# Patient Record
Sex: Female | Born: 1975 | Race: Black or African American | Hispanic: No | Marital: Married | Smoking: Never smoker
Health system: Southern US, Community
[De-identification: ages and names within clinical notes are randomized; demographics above are authoritative.]

## PROBLEM LIST (undated history)

## (undated) DIAGNOSIS — R51 Headache: Secondary | ICD-10-CM

## (undated) DIAGNOSIS — J45909 Unspecified asthma, uncomplicated: Secondary | ICD-10-CM

## (undated) DIAGNOSIS — L509 Urticaria, unspecified: Secondary | ICD-10-CM

## (undated) HISTORY — PX: DILATION AND CURETTAGE OF UTERUS: SHX78

## (undated) HISTORY — PX: HERNIA REPAIR: SHX51

## (undated) HISTORY — DX: Urticaria, unspecified: L50.9

## (undated) HISTORY — DX: Headache: R51

## (undated) HISTORY — DX: Unspecified asthma, uncomplicated: J45.909

## (undated) HISTORY — PX: INTRAUTERINE DEVICE (IUD) INSERTION: SHX5877

---

## 1997-09-16 ENCOUNTER — Emergency Department (HOSPITAL_COMMUNITY): Admission: EM | Admit: 1997-09-16 | Discharge: 1997-09-16 | Payer: Self-pay | Admitting: Emergency Medicine

## 1997-12-15 ENCOUNTER — Other Ambulatory Visit: Admission: RE | Admit: 1997-12-15 | Discharge: 1997-12-15 | Payer: Self-pay | Admitting: Obstetrics and Gynecology

## 1998-04-21 ENCOUNTER — Ambulatory Visit (HOSPITAL_BASED_OUTPATIENT_CLINIC_OR_DEPARTMENT_OTHER): Admission: RE | Admit: 1998-04-21 | Discharge: 1998-04-21 | Payer: Self-pay | Admitting: Surgery

## 2000-06-16 ENCOUNTER — Other Ambulatory Visit: Admission: RE | Admit: 2000-06-16 | Discharge: 2000-06-16 | Payer: Self-pay | Admitting: Obstetrics

## 2003-02-15 ENCOUNTER — Other Ambulatory Visit: Admission: RE | Admit: 2003-02-15 | Discharge: 2003-02-15 | Payer: Self-pay | Admitting: Obstetrics and Gynecology

## 2003-09-18 ENCOUNTER — Emergency Department (HOSPITAL_COMMUNITY): Admission: EM | Admit: 2003-09-18 | Discharge: 2003-09-19 | Payer: Self-pay | Admitting: Emergency Medicine

## 2006-01-28 ENCOUNTER — Encounter: Admission: RE | Admit: 2006-01-28 | Discharge: 2006-01-28 | Payer: Self-pay | Admitting: Obstetrics and Gynecology

## 2006-02-21 ENCOUNTER — Emergency Department (HOSPITAL_COMMUNITY): Admission: EM | Admit: 2006-02-21 | Discharge: 2006-02-21 | Payer: Self-pay | Admitting: Family Medicine

## 2006-10-09 ENCOUNTER — Encounter (INDEPENDENT_AMBULATORY_CARE_PROVIDER_SITE_OTHER): Payer: Self-pay | Admitting: Obstetrics and Gynecology

## 2006-10-09 ENCOUNTER — Ambulatory Visit (HOSPITAL_COMMUNITY): Admission: RE | Admit: 2006-10-09 | Discharge: 2006-10-09 | Payer: Self-pay | Admitting: Obstetrics and Gynecology

## 2007-11-03 ENCOUNTER — Ambulatory Visit: Admission: RE | Admit: 2007-11-03 | Discharge: 2007-11-03 | Payer: Self-pay | Admitting: Gynecologic Oncology

## 2007-12-15 ENCOUNTER — Ambulatory Visit: Admission: RE | Admit: 2007-12-15 | Discharge: 2007-12-15 | Payer: Self-pay | Admitting: Gynecologic Oncology

## 2007-12-16 ENCOUNTER — Ambulatory Visit (HOSPITAL_COMMUNITY): Admission: RE | Admit: 2007-12-16 | Discharge: 2007-12-16 | Payer: Self-pay | Admitting: Gynecologic Oncology

## 2008-02-07 ENCOUNTER — Emergency Department (HOSPITAL_COMMUNITY): Admission: EM | Admit: 2008-02-07 | Discharge: 2008-02-07 | Payer: Self-pay | Admitting: Emergency Medicine

## 2008-04-17 ENCOUNTER — Emergency Department (HOSPITAL_COMMUNITY): Admission: EM | Admit: 2008-04-17 | Discharge: 2008-04-17 | Payer: Self-pay | Admitting: Emergency Medicine

## 2008-06-15 ENCOUNTER — Inpatient Hospital Stay (HOSPITAL_COMMUNITY): Admission: AD | Admit: 2008-06-15 | Discharge: 2008-06-18 | Payer: Self-pay | Admitting: Obstetrics and Gynecology

## 2008-06-21 ENCOUNTER — Encounter: Admission: RE | Admit: 2008-06-21 | Discharge: 2008-07-21 | Payer: Self-pay | Admitting: Obstetrics and Gynecology

## 2008-07-22 ENCOUNTER — Encounter: Admission: RE | Admit: 2008-07-22 | Discharge: 2008-08-20 | Payer: Self-pay | Admitting: Obstetrics and Gynecology

## 2008-08-21 ENCOUNTER — Encounter: Admission: RE | Admit: 2008-08-21 | Discharge: 2008-09-20 | Payer: Self-pay | Admitting: Obstetrics and Gynecology

## 2008-09-15 ENCOUNTER — Encounter (INDEPENDENT_AMBULATORY_CARE_PROVIDER_SITE_OTHER): Payer: Self-pay | Admitting: Obstetrics and Gynecology

## 2008-09-15 ENCOUNTER — Ambulatory Visit (HOSPITAL_COMMUNITY): Admission: RE | Admit: 2008-09-15 | Discharge: 2008-09-16 | Payer: Self-pay | Admitting: Obstetrics and Gynecology

## 2008-09-21 ENCOUNTER — Encounter: Admission: RE | Admit: 2008-09-21 | Discharge: 2008-10-20 | Payer: Self-pay | Admitting: Obstetrics and Gynecology

## 2008-10-21 ENCOUNTER — Encounter: Admission: RE | Admit: 2008-10-21 | Discharge: 2008-11-20 | Payer: Self-pay | Admitting: Obstetrics and Gynecology

## 2008-11-21 ENCOUNTER — Encounter: Admission: RE | Admit: 2008-11-21 | Discharge: 2008-11-28 | Payer: Self-pay | Admitting: Obstetrics and Gynecology

## 2010-07-09 LAB — CBC
HCT: 34.9 % — ABNORMAL LOW (ref 36.0–46.0)
HCT: 39.8 % (ref 36.0–46.0)
Hemoglobin: 12.2 g/dL (ref 12.0–15.0)
Hemoglobin: 13.9 g/dL (ref 12.0–15.0)
MCHC: 35 g/dL (ref 30.0–36.0)
MCV: 90 fL (ref 78.0–100.0)
RBC: 3.88 MIL/uL (ref 3.87–5.11)
RBC: 4.44 MIL/uL (ref 3.87–5.11)
RDW: 13.2 % (ref 11.5–15.5)
WBC: 11 10*3/uL — ABNORMAL HIGH (ref 4.0–10.5)
WBC: 8.2 10*3/uL (ref 4.0–10.5)

## 2010-07-12 LAB — CBC
HCT: 34.5 % — ABNORMAL LOW (ref 36.0–46.0)
Hemoglobin: 11.6 g/dL — ABNORMAL LOW (ref 12.0–15.0)
MCHC: 33.8 g/dL (ref 30.0–36.0)
MCHC: 34 g/dL (ref 30.0–36.0)
MCV: 91.3 fL (ref 78.0–100.0)
Platelets: 219 10*3/uL (ref 150–400)
Platelets: 228 10*3/uL (ref 150–400)
RDW: 15.3 % (ref 11.5–15.5)
RDW: 16.1 % — ABNORMAL HIGH (ref 11.5–15.5)

## 2010-07-12 LAB — RPR: RPR Ser Ql: NONREACTIVE

## 2010-08-14 NOTE — Op Note (Signed)
NAMESATOYA, FEELEY                ACCOUNT NO.:  [id number]   MEDICAL RECORD NO.:  [id number]          PATIENT TYPE:  AMB   LOCATION:  SDC                           FACILITY:  [facility]   PHYSICIAN:  Maxie Better, M.D.DATE OF BIRTH:  05-10-1975   DATE OF PROCEDURE:  10/09/2006  DATE OF DISCHARGE:                               OPERATIVE REPORT   PREOPERATIVE DIAGNOSIS:  Missed abortion, twin gestation at 9 weeks,  vaginal bleeding.   PROCEDURES:  The ultrasound-guided suction dilation and evacuation.   POSTOPERATIVE DIAGNOSIS:  Missed abortion, twin gestation at 9 weeks,  vaginal bleeding.   ANESTHESIA:  MAC, paracervical block.   SURGEON:  Maxie Better, M.D.   INDICATIONS:  A 35 year old gravida 5, para 2-0-2-2 female who was found  by ultrasound to have fetal demise of twin gestation at 36 weeks and now  presents for surgical management on. The day of her procedure, the  patient started to have passage of fist size clots and bleeding and  wished to proceed with the procedure. Surgical risks had been reviewed  with the patient.  Consent was signed. The patient was transferred to  the operating room.   DESCRIPTION OF PROCEDURE:  Under adequate monitored anesthesia the  patient is placed in the dorsal lithotomy position.  She was sterilely  prepped and draped in usual fashion.  The bladder was catheterized small  amount of urine.  Examination under anesthesia revealed a uterus that  was about 8-week size with of clotted material and tissue at the  cervical os.  Bivalve speculum placed in the vagina and 20 mL of 1%  Nesacaine was injected paracervical at 3 and 9 o'clock.  The anterior  lip has the Nabothian cyst.  The anterior lip was grasped with a single-  tooth tenaculum.  Clotted material was removed from the cervical os.  The cervix was already dilated and easily accepted a #29 Pratt dilator.  A 8-mm curved suction cannula was introduced into the uterine cavity and  with ultrasound guidance the cavity was curetted of tissue had been  remained. Cavity was then curetted also under ultrasound  guidance. When  the endometrial stripe appeared to be normal, the procedure was then  terminated. All the instruments were then removed from the vagina.  Specimen labeled products of conception was sent to pathology.  Estimated blood loss was minimal.  Complication was none.  Maternal  blood type is A+.  The patient tolerated procedure well, was transferred  to recovery in stable condition.      Maxie Better, M.D.  Electronically Signed     [place]/MEDQ  D:  10/09/2006  T:  10/09/2006  Job:  [id number]

## 2010-08-14 NOTE — Consult Note (Signed)
NAMECARALINE, DEUTSCHMAN                ACCOUNT NO.:  [id number]   MEDICAL RECORD NO.:  [id number]          PATIENT TYPE:  OUT   LOCATION:  GYN                          FACILITY:  [facility]   PHYSICIAN:  Paola A. Duard Brady, MD    DATE OF BIRTH:  1975-08-24   DATE OF CONSULTATION:  11/03/2007  DATE OF DISCHARGE:                                 CONSULTATION   REFERRING PHYSICIAN:  Dr. Maxie Better.   Ms. Durwin Nora is a 35 year old gravida 6, para 2, who is currently at 7  weeks' gestation by last menstrual period and early ultrasound.  She had  a new OB ultrasound performed on October 27, 2007.  It revealed a single  heartbeat with a crown-rump length consistent with her LMP of September 12, 2007.  She had a right corpus luteum.  There was a left adnexal mass  with hyperechoic poor sound penetration shadowing measuring 7 x 4.9 x  6.3 cm, could not exclude a dermoid, 2 other small fibroids - 1  measuring 2 cm and 1 measuring 0.9 cm.  She is referred to [place] today for  evaluation and consideration of this adnexal mass.  She states that she  is really doing quite well.  She has fatigue associated with an early  pregnancy.  She states that prior to pregnancy and hindsight she has had  some left-sided discomfort, some bloating.  No nausea, though.   PAST SURGICAL HISTORY:  1. Umbilical hernia repair in 1998 with mesh.  2. Eye surgery.  3. NSVD x2.   MEDICATIONS:  1. Protopic for her face.  2. Prenatal vitamins.   ALLERGIES:  None.   PAST MEDICAL HISTORY:  None.   SOCIAL HISTORY:  She denies use of tobacco.  She drank alcohol socially  prior to pregnancy.  She is married.  She works as a Sports coach for  [employer], site.  Her husband as a Investment banker, operational.   FAMILY HISTORY:  Her mother has diabetes.  Her father had gastric  cancer.  Maternal grandmother with colon cancer.   PHYSICAL EXAMINATION:  Height 5 feet 8, weight 250 pounds, blood  pressure 106/66, pulse 80.  Well-nourished, well-developed female in  no  acute distress.  NECK:  Supple.  There is no lymphadenopathy, no thyromegaly.  LUNGS:  Clear to auscultation bilaterally.  CARDIOVASCULAR:  Regular rate and rhythm.  ABDOMEN:  Shows well-healed infraumbilical incision.  Abdomen is soft,  nontender, nondistended.  There are no palpable masses or  hepatosplenomegaly.  Groins are negative for adenopathy.  EXTREMITIES:  There was no edema.  PELVIC:  External genitalia was within normal limits.  Bimanual  examination:  Cervix is palpably normal, long and closed.  The uterus is  tilted anteverted.  There is a fullness in the posterior cul-de-sac.  She is nontender.   ASSESSMENT:  A 35 year old with a 7-week intrauterine pregnancy by early  ultrasound and LMP who has a complex-appearing 7-cm mass that is felt to  be consistent with a dermoid on early ultrasound.  Had a lengthy  discussion with the patient and her husband regarding management  of  this.  I would recommend a repeat ultrasound at 12-13 weeks.  If the  mass is stable or smaller, one could consider close follow-up during the  pregnancy.  If the mass is larger, may need to consider surgical  evaluation during the pregnancy.  The patient is obviously very tearful  during this part of the discussion.  It has been a year since she had a  miscarriage, and she is very excited about this pregnancy.  We did  discuss that if we needed to proceed with surgery we would like to do it  in the mid second trimester in the 14-16 week time frame as that  minimizes the risk to the fetus and the mother.  Because of her  umbilical hernia repair, I believe we would have to consider doing the  surgery at [facility] where we could utilize robotic techniques as it might be  helpful.  The patient is unclear whether or not she has permanent or  temporary mesh.  Their multiple questions were elicited and answered to  their satisfaction.  I will call Dr. Cherly Hensen tomorrow and speak with her  regarding this case,  making sure that the ultrasound is arranged and the  results communicated to [place] so we can follow up on the patient.      Paola A. Duard Brady, MD  Electronically Signed     PAG/MEDQ  D:  11/03/2007  T:  11/03/2007  Job:  [id number]   cc:   Maxie Better, M.D.  Fax: [phone]   Telford Nab, R.N.  [address]  [place], [place] [address]

## 2010-08-14 NOTE — Op Note (Signed)
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   PROCEDURES:  Minilaparotomy, left ovarian cystectomy, removal of right  corpus luteal cyst.   POSTOPERATIVE DIAGNOSES:  Left ovarian dermoid, pending final pathology,  right corpus luteal cyst.   ANESTHESIA:  General.   SURGEON:  Maxie Better, MD   ASSISTANT:  Genia Del, MD   PROCEDURE:  Under adequate general anesthesia, the patient was placed in  a supine position.  She was sterilely prepped and draped in usual  fashion.  Indwelling Foley catheter had been placed. Marcaine 0.25% was  injected along the planned small incision.  A small incision was then  made, carried down to the rectus fascia, the rectus fascia was opened  transversely.  The rectus fascia was then bluntly and sharply dissected  off the rectus muscle in superior and inferior fashion.  Rectus muscles  split in midline.  Parietal peritoneum was entered bluntly and the  extended.  Palpation revealed a normal uterus, tight ovary that had  ruptured cyst with the content extruding.  The left ovary enlarged about  4.5 cm with a cystic mass.  This was exteriorized.  The left ovarian  cyst and using a #15 blade, incision was carefully made on the  antimesenteric border and using careful dissection, the cystic mass  removed in total.  The base had some bleeding which were clamped and  suture ligated with 3-0 Vicryl suture.  The remaining the ovary was  closed in a rosette fashion using 0 Vicryl suture and this surface  approximated with interrupted 3-0 Vicryl sutures.  Good  hemostasis  noted.  Fallopian tubes were normal.  The corpus luteal cyst material  was protruding and that was removed and sent.  The bleeding site of this  ovary on the right was cauterized with good hemostasis.  Irrigation was  performed.  Suctioned and debris with good hemostasis noted bilaterally.  The parietal peritoneum was not closed.  The rectus fascia was with 0  Vicryl x2.  Subcutaneous areas irrigated, small bleeders cauterized.  Interrupted 2-0 plain sutures placed and 4-0 Monocryl subcuticular  suture was then done.  The Steri-Strips were then placed.  Specimen was  corpus luteal cyst and left ovarian dermoid cyst, all sent to Pathology.  Estimated blood loss was minimal.  Intraoperative fluid was 1200 mL.  Urine output was 100 mL, concentrated urine.  Sponge and instrument  counts x2 was correct.  Complications none.  The patient tolerated the  procedure well and was transferred to recovery in stable condition.      Maxie Better, M.D.  Electronically Signed    [place]/MEDQ  D:  09/15/2008  T:  09/16/2008  Job:  [id number]

## 2010-08-14 NOTE — Consult Note (Signed)
[name], [name]               ACCOUNT NO.:  [id number]   MEDICAL RECORD NO.:  [id number]          PATIENT TYPE:  OUT   LOCATION:  GYN                          FACILITY:  [facility]   PHYSICIAN:  Paola A. Duard Brady, MD    DATE OF BIRTH:  1976-04-01   DATE OF CONSULTATION:  12/15/2007  DATE OF DISCHARGE:  12/15/2007                                 CONSULTATION   The patient is a 34 year old gravida 6, para 2, abortus 3 who was  initially seen by me November 03, 2007.  At that time she was approximately  7 weeks' gestation.  She did have an ultrasound early in pregnancy that  showed a right corpus luteum cyst within the left.  She had an adnexal  mass with hyperkeratotic changes and poor sound penetration.  The mass  measured 7 x 4.9 x 6.3 cm.  Could not exclude a dermoid.  She has some  small fibroids.  I had a lengthy discussion with the patient and her  husband at that time and they were not sure how they wished to proceed  with regard to surgical management.  A lengthy discussion was had; at  least an hour discussing the need for repeat imaging to rule out  persistence in the mid second trimester.  I have also spoken to Dr.  Cherly Hensen.  The patient did undergo a repeat ultrasound on December 10, 2007.  It reveals concordance of her gestational age.  It revealed a 2.2-  cm fibroid within the left adnexa.  It reveals a complex mass measuring  6.9 x 4.6 x 5.8 cm.  There is no other comment regarding the appearance  of the mass available.  The patient comes in today for discussion of the  above.  She is starting to feel flutters that she states she has done  for the past few weeks.  She denies any bleeding or cramping.  Her  energy level is increased.  She denies any abdominal or pelvic pain.   PHYSICAL EXAMINATION:  Weight 206 pounds.  Blood pressure 106/60.  A well-nourished, well-developed female in no acute distress.   Thirty minutes face-to-face time was spent with the patient and her  husband.  Again, we reviewed where we are.  I do not necessarily believe  that this is an obvious malignant process as over the past 5-6 weeks  this mass has not enlarged based on imaging.  There are no comments  regarding any ascites.  The patient came in today with a preconceived  notion that we would be able to offer her surgery in about 3-4 months.  She states she is on probation from her job until December and would  like to delay the surgery until December.  I discussed with her that  with a pregnancy, the best time for surgery to minimize the risks of  fetal loss and risks to the patient would be in the mid second trimester  and that in December I would not recommend elective surgery.  She was  not aware of this, though this was a lengthy discussion and she  expressed understanding of the timing at her first visit with me.  I did  discuss with them that there is approximately a 1% chance that this is a  neoplastic process that could be worsening if it is not managed during  the pregnancy.  They are reassured by the fact that the mass has not  increased in size.  This is very much a desired pregnancy and the  patient understandably is very hesitant to proceed with anything that  could damage or harm her pregnancy.  We discussed other imaging that  could potentially help [place] delineate the mass better.  We discussed the  role of MRI.  The patient, after our discussion, wishes to proceed with  an abdominal pelvic MRI.  We did call Dr. Cherly Hensen' office and obtained a  verbal okay for an MRI during pregnancy by Dr. Ernestina Penna.  The patient is  tentatively scheduled for an MRI of the abdomen and pelvis on December 16, 2007 at 10:30.  Instructions for the MRI were given to the patient.  Will contact her with the results of the MRI once they are available.  If it appears to be a dermoid on imaging, based on the discussion that  we had today, the patient would like to continue to follow this   expectantly and either manage it after pregnancy or should she require  cesarean section for obstetrical indications, have it done at that time.  Further complicating the desire to do this in a minimally-invasive  fashion, the patient had an umbilical hernia repair with mesh.  She did  not know if this was a permanent or temporary mesh, and that would also  potentially complicate a minimally-invasive approach to surgery.  I will  call the patient once the  results of the MRI are available and will determine her final  disposition.  At this point, however, she is leaning towards close  follow-up, expectant management during the pregnancy.  She does  understand the risks of torsion, rupture and possible progression of her  disease process should this be a malignancy.      Paola A. Duard Brady, MD  Electronically Signed     PAG/MEDQ  D:  12/15/2007  T:  12/17/2007  Job:  [id number]   cc:   Maxie Better, M.D.  Fax: [phone]   Telford Nab, R.N.  [address]  [place], [place] [address]

## 2011-01-15 LAB — CBC
MCV: 89.8
Platelets: 236
RBC: 4.06
WBC: 7

## 2012-08-20 ENCOUNTER — Other Ambulatory Visit: Payer: Self-pay | Admitting: Family Medicine

## 2012-08-20 ENCOUNTER — Encounter: Payer: Self-pay | Admitting: Family Medicine

## 2012-08-20 ENCOUNTER — Ambulatory Visit (INDEPENDENT_AMBULATORY_CARE_PROVIDER_SITE_OTHER): Payer: 59 | Admitting: Family Medicine

## 2012-08-20 ENCOUNTER — Ambulatory Visit (HOSPITAL_BASED_OUTPATIENT_CLINIC_OR_DEPARTMENT_OTHER)
Admission: RE | Admit: 2012-08-20 | Discharge: 2012-08-20 | Disposition: A | Discharge status: Home / Self Care | Payer: 59 | Source: Ambulatory Visit | Attending: Family Medicine | Admitting: Family Medicine

## 2012-08-20 VITALS — BP 109/76 | HR 79 | Ht 68.0 in | Wt 205.0 lb

## 2012-08-20 DIAGNOSIS — R0602 Shortness of breath: Secondary | ICD-10-CM | POA: Insufficient documentation

## 2012-08-20 DIAGNOSIS — K219 Gastro-esophageal reflux disease without esophagitis: Secondary | ICD-10-CM

## 2012-08-20 DIAGNOSIS — J302 Other seasonal allergic rhinitis: Secondary | ICD-10-CM

## 2012-08-20 DIAGNOSIS — J309 Allergic rhinitis, unspecified: Secondary | ICD-10-CM

## 2012-08-20 DIAGNOSIS — R05 Cough: Secondary | ICD-10-CM | POA: Insufficient documentation

## 2012-08-20 DIAGNOSIS — R059 Cough, unspecified: Secondary | ICD-10-CM | POA: Insufficient documentation

## 2012-08-20 MED ORDER — ALBUTEROL SULFATE HFA 108 (90 BASE) MCG/ACT IN AERS: 2.0000 | INHALATION_SPRAY | Freq: Four times a day (QID) | RESPIRATORY_TRACT | Status: DC | PRN

## 2012-08-20 MED ORDER — CETIRIZINE HCL 10 MG PO TABS: 10.0000 mg | ORAL_TABLET | Freq: Every day | ORAL | Status: DC

## 2012-08-20 MED ORDER — MONTELUKAST SODIUM 10 MG PO TABS: 10.0000 mg | ORAL_TABLET | Freq: Every day | ORAL | Status: DC

## 2012-08-20 MED ORDER — BUDESONIDE-FORMOTEROL FUMARATE 160-4.5 MCG/ACT IN AERO: 2.0000 | INHALATION_SPRAY | Freq: Two times a day (BID) | RESPIRATORY_TRACT | Status: DC

## 2012-08-20 MED ORDER — BENZONATATE 200 MG PO CAPS: 200.0000 mg | ORAL_CAPSULE | Freq: Three times a day (TID) | ORAL | Status: DC | PRN

## 2012-08-20 MED ORDER — HYDROCOD POLST-CHLORPHEN POLST 10-8 MG/5ML PO LQCR: 5.0000 mL | Freq: Two times a day (BID) | ORAL | Status: DC | PRN

## 2012-08-20 MED ORDER — MOMETASONE FUROATE 50 MCG/ACT NA SUSP: 2.0000 | Freq: Every day | NASAL | Status: DC

## 2012-08-20 MED ORDER — FEXOFENADINE-PSEUDOEPHED ER 60-120 MG PO TB12: 1.0000 | ORAL_TABLET | Freq: Two times a day (BID) | ORAL | Status: DC

## 2012-08-20 MED ORDER — PANTOPRAZOLE SODIUM 40 MG PO TBEC: 40.0000 mg | DELAYED_RELEASE_TABLET | Freq: Every day | ORAL | Status: DC

## 2012-08-20 NOTE — Progress Notes (Signed)
  Subjective:    Patient ID: [name], female    DOB: 07-Sep-1975, 37 y.o.   MRN: [id number]  [name] is here today complaining of cough and shortness of breath.   Shortness of Breath The current episode started more than 1 month ago. The problem has been rapidly worsening. Associated symptoms include chest pain, headaches, orthopnea, rhinorrhea, a sore throat and wheezing. Pertinent negatives include no ear pain or fever. The symptoms are aggravated by exercise. She has tried nothing for the symptoms. Her past medical history is significant for allergies and asthma.   Review of Systems  Constitutional: Negative for fever and chills.  HENT: Positive for sore throat, rhinorrhea and postnasal drip. Negative for ear pain.   Eyes: Negative for discharge.  Respiratory: Positive for cough, chest tightness, shortness of breath and wheezing.   Cardiovascular: Positive for chest pain and orthopnea.  Allergic/Immunologic: Positive for environmental allergies.  Neurological: Positive for headaches.   Past Medical History  Diagnosis Date  . Headache(784.0)    Family History  Problem Relation Age of Onset  . Cancer Father   . 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 No distress.  HENT:  Head: Normocephalic.  Mouth/Throat: Oropharynx is clear and moist.  Eyes: Conjunctivae are normal. Right eye exhibits no discharge. Left eye exhibits no discharge. No scleral icterus.  Neck: Neck supple. No thyromegaly present.  Cardiovascular:  Normal rate, regular rhythm and normal heart sounds.  Exam reveals no gallop and no friction rub.   No murmur heard. Pulmonary/Chest: Effort normal and breath sounds normal. No respiratory distress. She has no wheezes. She has no rales. She exhibits no tenderness.  Lymphadenopathy:    She has no cervical adenopathy.  Neurological: She is alert.  Skin: Skin is warm and dry. No rash noted.  Psychiatric: She has a normal mood and affect.      Assessment & Plan:   TIME 30 MINUTES:  MORE THAN 50 % OF TIME WAS INVOLVED IN COUNSELING.

## 2012-08-20 NOTE — Patient Instructions (Addendum)
1)  Allergies - Try a Zyrtec 10 mg at night and then take a 12 hr Allegra D in the am.  If you continue to be stuffy then you could try some Nasonex 2 puffs in nostril at night .    2)  Asthma/Wheezing - Take 2 puffs of the albuterol inhaler as needed for shortness of breath.  Try the Symbicort sample 2 puffs 2 times per day and remember to rinse.  Another add on could be the Singulair which helps allergies and asthma.  3)  Cough - Delsym 2 tsp 2 times per day or RX cough meds (Tessalon Perles and or Tussionex )   Cough Drop Ricola Honey Lemon with Echinacea.  Honey tsp sprinkle with cinnnamon.    4)  Umcka Cold Care - 2 droppers on tongue 3-4 times per day.    5)  If you continue to cough, you might want to get back on the Protonix.    Cough, Adult  A cough is a reflex that helps clear your throat and airways. It can help heal the body or may be a reaction to an irritated airway. A cough may only last 2 or 3 weeks (acute) or may last more than 8 weeks (chronic).  CAUSES Acute cough:  Viral or bacterial infections. Chronic cough:  Infections.  Allergies.  Asthma.  Post-nasal drip.  Smoking.  Heartburn or acid reflux.  Some medicines.  Chronic lung problems (COPD).  Cancer. SYMPTOMS   Cough.  Fever.  Chest pain.  Increased breathing rate.  High-pitched whistling sound when breathing (wheezing).  Colored mucus that you cough up (sputum). TREATMENT   A bacterial cough may be treated with antibiotic medicine.  A viral cough must run its course and will not respond to antibiotics.  Your caregiver may recommend other treatments if you have a chronic cough. HOME CARE INSTRUCTIONS   Only take over-the-counter or prescription medicines for pain, discomfort, or fever as directed by your caregiver. Use cough suppressants only as directed by your caregiver.  Use a cold steam vaporizer or humidifier in your bedroom or home to help loosen secretions.  Sleep in a  semi-upright position if your cough is worse at night.  Rest as needed.  Stop smoking if you smoke. SEEK IMMEDIATE MEDICAL CARE IF:   You have pus in your sputum.  Your cough starts to worsen.  You cannot control your cough with suppressants and are losing sleep.  You begin coughing up blood.  You have difficulty breathing.  You develop pain which is getting worse or is uncontrolled with medicine.  You have a fever. MAKE SURE YOU:   Understand these instructions.  Will watch your condition.  Will get help right away if you are not doing well or get worse. Document Released: 09/14/2010 Document Revised: 06/10/2011 Document Reviewed: 09/14/2010 [facility] Patient Information 2014 [place], [place].

## 2012-09-05 ENCOUNTER — Encounter: Payer: Self-pay | Admitting: Family Medicine

## 2012-09-05 DIAGNOSIS — J302 Other seasonal allergic rhinitis: Secondary | ICD-10-CM | POA: Insufficient documentation

## 2012-09-05 DIAGNOSIS — R059 Cough, unspecified: Secondary | ICD-10-CM | POA: Insufficient documentation

## 2012-09-05 DIAGNOSIS — R0602 Shortness of breath: Secondary | ICD-10-CM | POA: Insufficient documentation

## 2012-09-05 DIAGNOSIS — R05 Cough: Secondary | ICD-10-CM | POA: Insufficient documentation

## 2012-09-05 DIAGNOSIS — K219 Gastro-esophageal reflux disease without esophagitis: Secondary | ICD-10-CM | POA: Insufficient documentation

## 2012-09-05 NOTE — Assessment & Plan Note (Addendum)
We sent [name] for a CXR which was normal. She had a nebulizer treatment which helped her feeling of being short of breath. She was given medications for asthma including Singulair, Symbicort and Proventil.

## 2012-09-05 NOTE — Assessment & Plan Note (Addendum)
We discussed reasons why she might have a cough (asthma, GERD, post-nasal drip).  She was given medications for cough.

## 2012-09-05 NOTE — Assessment & Plan Note (Signed)
She was given prescriptions for allergy medications.

## 2012-09-05 NOTE — Assessment & Plan Note (Signed)
She was given a prescription for Protonix.

## 2012-09-28 ENCOUNTER — Ambulatory Visit: Payer: 59 | Admitting: Family Medicine

## 2012-11-12 ENCOUNTER — Ambulatory Visit (INDEPENDENT_AMBULATORY_CARE_PROVIDER_SITE_OTHER): Payer: 59 | Admitting: Family Medicine

## 2012-11-12 ENCOUNTER — Encounter: Payer: Self-pay | Admitting: *Deleted

## 2012-11-12 ENCOUNTER — Encounter: Payer: Self-pay | Admitting: Family Medicine

## 2012-11-12 VITALS — BP 116/82 | HR 64 | Resp 16 | Ht 68.0 in | Wt 206.0 lb

## 2012-11-12 DIAGNOSIS — F411 Generalized anxiety disorder: Secondary | ICD-10-CM

## 2012-11-12 DIAGNOSIS — R41840 Attention and concentration deficit: Secondary | ICD-10-CM

## 2012-11-12 MED ORDER — LISDEXAMFETAMINE DIMESYLATE 50 MG PO CAPS: 50.0000 mg | ORAL_CAPSULE | ORAL | Status: DC

## 2012-11-12 MED ORDER — ESCITALOPRAM OXALATE 5 MG PO TABS: 5.0000 mg | ORAL_TABLET | Freq: Every day | ORAL | Status: DC

## 2012-11-12 NOTE — Progress Notes (Signed)
  Subjective:    Patient ID: [name], female    DOB: 1975/04/02, 37 y.o.   MRN: [id number]  HPI  [name] is here today to discuss her stress.  She has been feeling very overwhelmed at her job.  She feels that her increased job duties have increased her stress level.  She feels that taking some time away from her job can helped settle her nerves.  She feels that her job stress is affecting her marriage as well.  She also wants to start back on her Vyvanse.     Review of Systems  Constitutional: Positive for fatigue.  Respiratory: Negative for shortness of breath.   Cardiovascular: Positive for palpitations.  Psychiatric/Behavioral: Positive for sleep disturbance and decreased concentration. The patient is nervous/anxious.     Past Medical History  Diagnosis Date  . Headache(784.0)     Family History  Problem Relation Age of Onset  . Cancer Father   . Cancer Paternal Grandmother   .  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 Constitutional: She is oriented to person, place, and time.  Eyes: Conjunctivae are normal. No scleral icterus.  Neck: Neck supple. No thyromegaly present.  Cardiovascular: Normal rate, regular rhythm and normal heart sounds.   Pulmonary/Chest: Effort normal and breath sounds normal.  Musculoskeletal: She exhibits no edema and no tenderness.  Lymphadenopathy:    She has no cervical adenopathy.  Neurological: She is alert and oriented to person, place, and time.  Skin: Skin is warm and dry.  Psychiatric: She has a normal mood and affect. Her  behavior is normal. Judgment and thought content normal.  Stressed       Assessment & Plan:

## 2012-12-27 ENCOUNTER — Encounter: Payer: Self-pay | Admitting: Family Medicine

## 2012-12-27 DIAGNOSIS — F411 Generalized anxiety disorder: Secondary | ICD-10-CM | POA: Insufficient documentation

## 2012-12-27 DIAGNOSIS — R41840 Attention and concentration deficit: Secondary | ICD-10-CM | POA: Insufficient documentation

## 2012-12-27 NOTE — Assessment & Plan Note (Signed)
We had a long discussion about the stress that she is currently under at work.  She is going to take some time off and get started on Lexapro.

## 2012-12-27 NOTE — Assessment & Plan Note (Signed)
Refilled her Vyvanse 

## 2013-07-06 ENCOUNTER — Ambulatory Visit: Payer: 59 | Admitting: Family Medicine

## 2014-04-01 NOTE — L&D Delivery Note (Signed)
TC @ 1637 from Marrion CoyLisa Brewer, RN to come for delivery / TC @ [phone] from Dr. Cherly Hensenousins requesting CNM deliver patient Delivery Note  First Stage: Labor onset: 0853 Augmentation : pitocin Analgesia /Anesthesia intrapartum: epidural AROM at 1447  Second Stage: Complete dilation at 1633 Onset of pushing at 1645 FHR second stage 155 (scalp electrode)  Delivery of a viable female at 1657 by CNM in OA position Loose nuchal cord - unable to reduce over fetal head / infant delivered through [place] and somersaulted to release Cord double clamped after cessation of pulsation, cut by FOB Cord blood sample collected   Third Stage: Placenta delivered via Tomasa BlaseSchultz intact with 3 VC @ 1700 Placenta disposition: L&D Uterine tone firm / bleeding moderate with some large clots  No laceration identified  Est. Blood Loss (mL): 505  Complications: none  Mom to postpartum.  Baby to Couplet care / Skin to Skin.  Newborn: Birth Weight: 6 lbs 13 oz  Apgar Scores: 8/9 Feeding planned: breast  *Dr. Cherly Hensenousins notified of delivery and no immediate complications  [name], [name], M  MSN, CNM 08/10/2014, 5:13 PM

## 2014-04-09 ENCOUNTER — Encounter (HOSPITAL_COMMUNITY): Payer: Self-pay | Admitting: *Deleted

## 2014-04-09 ENCOUNTER — Emergency Department (HOSPITAL_COMMUNITY)
Admission: EM | Admit: 2014-04-09 | Discharge: 2014-04-09 | Disposition: A | Discharge status: Home / Self Care | Payer: 59 | Attending: Emergency Medicine | Admitting: Emergency Medicine

## 2014-04-09 ENCOUNTER — Emergency Department (INDEPENDENT_AMBULATORY_CARE_PROVIDER_SITE_OTHER)
Admission: EM | Admit: 2014-04-09 | Discharge: 2014-04-09 | Disposition: A | Discharge status: Home / Self Care | Payer: 59 | Source: Home / Self Care | Attending: Emergency Medicine | Admitting: Emergency Medicine

## 2014-04-09 ENCOUNTER — Encounter (HOSPITAL_COMMUNITY): Payer: Self-pay | Admitting: Emergency Medicine

## 2014-04-09 DIAGNOSIS — Z3490 Encounter for supervision of normal pregnancy, unspecified, unspecified trimester: Secondary | ICD-10-CM

## 2014-04-09 DIAGNOSIS — Z79899 Other long term (current) drug therapy: Secondary | ICD-10-CM | POA: Diagnosis not present

## 2014-04-09 DIAGNOSIS — M79661 Pain in right lower leg: Secondary | ICD-10-CM

## 2014-04-09 DIAGNOSIS — M79609 Pain in unspecified limb: Secondary | ICD-10-CM

## 2014-04-09 DIAGNOSIS — M79604 Pain in right leg: Secondary | ICD-10-CM | POA: Insufficient documentation

## 2014-04-09 DIAGNOSIS — Z331 Pregnant state, incidental: Secondary | ICD-10-CM

## 2014-04-09 NOTE — Discharge Instructions (Signed)
Tylenol for your leg pain as needed.  Follow-up with your physician as needed.

## 2014-04-09 NOTE — ED Notes (Signed)
Pt sent here from [facility]. Reports being approx [redacted] weeks pregnant, having right leg pain/discomfort x 2 weeks. Reports pain is from mid calf up into her thigh, pain will wake her up at night.

## 2014-04-09 NOTE — ED Notes (Signed)
Pt states that she has been having right leg pain for a week. That wakes her up out of her sleep.

## 2014-04-09 NOTE — ED Provider Notes (Signed)
   Chief Complaint   Leg Pain   History of Present Illness   [name] is a 39 year old female who is [redacted] weeks pregnant. She presents with a two-week history of pain and swelling of the right calf. This will her up at night on several occasions. She has swelling of the calf and around the knee. The leg feels numb and tingly. She was short of breath last night but denies any chest pain, cough, or hemoptysis. She has no prior history of DVT, pulmonary embolism, or thrombophlebitis. There is no family history of DVT, thrombophlebitis, pulmonary embolism. This pregnancy is going well for so far, no complications, bleeding, or abdominal pain. No history of high blood pressure or diabetes.  Review of Systems   Other than as noted above, the patient denies any of the following symptoms: Systemic:  No fever, chills, weight gain or loss. Respiratory:  No coughing, wheezing, or shortness of breath. Cardiac:  No chest pain, tightness, pressure or syncope. GI:  No abdominal pain, swelling, distension, nausea, or vomiting. GU:  No dysuria, frequency, or hematuria. Ext:  No joint pain or muscle pain.  PMFSH   Past medical history, family history, social history, meds, and allergies were reviewed.    Physical Examination     Vital signs:  BP 105/77 mmHg  Pulse 120  Temp(Src) 98.6 F (37 C) (Oral)  Resp 16  SpO2 96% Gen:  Alert, oriented, in no distress. Neck:  No tenderness, adenopathy, or JVD. Lungs:  Breath sounds clear and equal bilaterally.  No rales, rhonchi or wheezes. Heart:  Regular rhythm, no gallops or murmers. Abdomen:  Soft, nontender, no organomegaly or mass. Ext:  There is no obvious swelling, no pitting edema, no varicose veins, no calf tenderness and Homans sign is negative. There is no pain to palpation over the deep venous system. Calf circumference is 40.5 cm on the right and 40 cm on the left. Neuro:  Alert and oriented times 3.  No muscle weakness.  Sensation  intact to light touch. Skin:  Warm and dry.  No rash or skin lesions.   Assessment   The primary encounter diagnosis was Calf pain, right. A diagnosis of Pregnancy was also pertinent to this visit.  Plan   The patient was transferred to the ED via shuttle in stable condition.  Medical Decision Making:  39 year old 3420 week pregnant female has a 2 week history of right calf pain and swelling.  Also feels short of breath.  Exam is unremarkable, but she is at high risk of DVT and/or PE.  Needs further workup.      Reuben Likesavid C Aristides Luckey, MD 04/09/14 [phone]

## 2014-04-09 NOTE — ED Notes (Signed)
Pt will be transferred over to [facility] for further evaluation. Spoke with 1st nurse Scarlet RN. Pt will be going to the emergency room via shuttle.

## 2014-04-09 NOTE — Discharge Instructions (Signed)
We have determined that your problem requires further evaluation in the emergency department.  We will take care of your transport there.  Once at the emergency department, you will be evaluated by a provider and they will order whatever treatment or tests they deem necessary.  We cannot guarantee that they will do any specific test or do any specific treatment.    Deep Vein Thrombosis A deep vein thrombosis (DVT) is a blood clot that develops in the deep, larger veins of the leg, arm, or pelvis. These are more dangerous than clots that might form in veins near the surface of the body. A DVT can lead to serious and even life-threatening complications if the clot breaks off and travels in the bloodstream to the lungs.  A DVT can damage the valves in your leg veins so that instead of flowing upward, the blood pools in the lower leg. This is called post-thrombotic syndrome, and it can result in pain, swelling, discoloration, and sores on the leg. CAUSES Usually, several things contribute to the formation of blood clots. Contributing factors include:  The flow of blood slows down.  The inside of the vein is damaged in some way.  You have a condition that makes blood clot more easily. RISK FACTORS Some people are more likely than others to develop blood clots. Risk factors include:   Smoking.  Being overweight (obese).  Sitting or lying still for a long time. This includes long-distance travel, paralysis, or recovery from an illness or surgery. Other factors that increase risk are:   Older age, especially over 41 years of age.  Having a family history of blood clots or if you have already had a blot clot.  Having major or lengthy surgery. This is especially true for surgery on the hip, knee, or belly (abdomen). Hip surgery is particularly high risk.  Having a long, thin tube (catheter) placed inside a vein during a medical procedure.  Breaking a hip or leg.  Having cancer or cancer  treatment.  Pregnancy and childbirth.  Hormone changes make the blood clot more easily during pregnancy.  The fetus puts pressure on the veins of the pelvis.  There is a risk of injury to veins during delivery or a caesarean delivery. The risk is highest just after childbirth.  Medicines containing the female hormone estrogen. This includes birth control pills and hormone replacement therapy.  Other circulation or heart problems.  SIGNS AND SYMPTOMS When a clot forms, it can either partially or totally block the blood flow in that vein. Symptoms of a DVT can include:  Swelling of the leg or arm, especially if one side is much worse.  Warmth and redness of the leg or arm, especially if one side is much worse.  Pain in an arm or leg. If the clot is in the leg, symptoms may be more noticeable or worse when standing or walking. The symptoms of a DVT that has traveled to the lungs (pulmonary embolism, PE) usually start suddenly and include:  Shortness of breath.  Coughing.  Coughing up blood or blood-tinged mucus.  Chest pain. The chest pain is often worse with deep breaths.  Rapid heartbeat. Anyone with these symptoms should get emergency medical treatment right away. Do not wait to see if the symptoms will go away. Call your local emergency services (911 in the [place] if you have these symptoms. Do not drive yourself to the hospital. DIAGNOSIS If a DVT is suspected, your health care provider will take a  full medical history and perform a physical exam. Tests that also may be required include:  Blood tests, including studies of the clotting properties of the blood.  Ultrasound to see if you have clots in your legs or lungs.  X-rays to show the flow of blood when dye is injected into the veins (venogram).  Studies of your lungs if you have any chest symptoms. PREVENTION  Exercise the legs regularly. Take a brisk 30-minute walk every day.  Maintain a weight that is  appropriate for your height.  Avoid sitting or lying in bed for long periods of time without moving your legs.  Women, particularly those over the age of 35 years, should consider the risks and benefits of taking estrogen medicines, including birth control pills.  Do not smoke, especially if you take estrogen medicines.  Long-distance travel can increase your risk of DVT. You should exercise your legs by walking or pumping the muscles every hour.  Many of the risk factors above relate to situations that exist with hospitalization, either for illness, injury, or elective surgery. Prevention may include medical and nonmedical measures.  Your health care provider will assess you for the need for venous thromboembolism prevention when you are admitted to the hospital. If you are having surgery, your surgeon will assess you the day of or day after surgery. TREATMENT Once identified, a DVT can be treated. It can also be prevented in some circumstances. Once you have had a DVT, you may be at increased risk for a DVT in the future. The most common treatment for DVT is blood-thinning (anticoagulant) medicine, which reduces the blood's tendency to clot. Anticoagulants can stop new blood clots from forming and stop old clots from growing. They cannot dissolve existing clots. Your body does this by itself over time. Anticoagulants can be given by mouth, through an IV tube, or by injection. Your health care provider will determine the best program for you. Other medicines or treatments that may be used are:  Heparin or related medicines (low molecular weight heparin) are often the first treatment for a blood clot. They act quickly. However, they cannot be taken orally and must be given either in shot form or by IV tube.  Heparin can cause a fall in a component of blood that stops bleeding and forms blood clots (platelets). You will be monitored with blood tests to be sure this does not occur.  Warfarin is an  anticoagulant that can be swallowed. It takes a few days to start working, so usually heparin or related medicines are used in combination. Once warfarin is working, heparin is usually stopped.  Factor Xa inhibitor medicines, such as rivaroxaban and apixaban, also reduce blood clotting. These medicines are taken orally and can often be used without heparin or related medicines.  Less commonly, clot dissolving drugs (thrombolytics) are used to dissolve a DVT. They carry a high risk of bleeding, so they are used mainly in severe cases where your life or a part of your body is threatened.  Very rarely, a blood clot in the leg needs to be removed surgically.  If you are unable to take anticoagulants, your health care provider may arrange for you to have a filter placed in a main vein in your abdomen. This filter prevents clots from traveling to your lungs. HOME CARE INSTRUCTIONS  Take all medicines as directed by your health care provider.  Learn as much as you can about DVT.  Wear a medical alert bracelet or carry a  medical alert card.  Ask your health care provider how soon you can go back to normal activities. It is important to stay active to prevent blood clots. If you are on anticoagulant medicine, avoid contact sports.  It is very important to exercise. This is especially important while traveling, sitting, or standing for long periods of time. Exercise your legs by walking or by tightening and relaxing your leg muscles regularly. Take frequent walks.  You may need to wear compression stockings. These are tight elastic stockings that apply pressure to the lower legs. This pressure can help keep the blood in the legs from clotting. Taking Warfarin Warfarin is a daily medicine that is taken by mouth. Your health care provider will advise you on the length of treatment (usually 3-6 months, sometimes lifelong). If you take warfarin:  Understand how to take warfarin and foods that can affect  how warfarin works in Public relations account executive.  Too much and too little warfarin are both dangerous. Too much warfarin increases the risk of bleeding. Too little warfarin continues to allow the risk for blood clots. Warfarin and Regular Blood Testing While taking warfarin, you will need to have regular blood tests to measure your blood clotting time. These blood tests usually include both the prothrombin time (PT) and international normalized ratio (INR) tests. The PT and INR results allow your health care provider to adjust your dose of warfarin. It is very important that you have your PT and INR tested as often as directed by your health care provider.  Warfarin and Your Diet Avoid major changes in your diet, or notify your health care provider before changing your diet. Arrange a visit with a registered dietitian to answer your questions. Many foods, especially foods high in vitamin K, can interfere with warfarin and affect the PT and INR results. You should eat a consistent amount of foods high in vitamin K. Foods high in vitamin K include:   Spinach, kale, broccoli, cabbage, collard and turnip greens, Brussels sprouts, peas, cauliflower, seaweed, and parsley.  Beef and pork liver.  Green tea.  Soybean oil. Warfarin with Other Medicines Many medicines can interfere with warfarin and affect the PT and INR results. You must:  Tell your health care provider about any and all medicines, vitamins, and supplements you take, including aspirin and other over-the-counter anti-inflammatory medicines. Be especially cautious with aspirin and anti-inflammatory medicines. Ask your health care provider before taking these.  Do not take or discontinue any prescribed or over-the-counter medicine except on the advice of your health care provider or pharmacist. Warfarin Side Effects Warfarin can have side effects, such as easy bruising and difficulty stopping bleeding. Ask your health care provider or pharmacist about  other side effects of warfarin. You will need to:  Hold pressure over cuts for longer than usual.  Notify your dentist and other health care providers that you are taking warfarin before you undergo any procedures where bleeding may occur. Warfarin with Alcohol and Tobacco   Drinking alcohol frequently can increase the effect of warfarin, leading to excess bleeding. It is best to avoid alcoholic drinks or to consume only very small amounts while taking warfarin. Notify your health care provider if you change your alcohol intake.   Do not use any tobacco products including cigarettes, chewing tobacco, or electronic cigarettes. If you smoke, quit. Ask your health care provider for help with quitting smoking. Alternative Medicines to Warfarin: Factor Xa Inhibitor Medicines  These blood-thinning medicines are taken by mouth, usually for  several weeks or longer. It is important to take the medicine every single day at the same time each day.  There are no regular blood tests required when using these medicines.  There are fewer food and drug interactions than with warfarin.  The side effects of this class of medicine are similar to those of warfarin, including excessive bruising or bleeding. Ask your health care provider or pharmacist about other potential side effects. SEEK MEDICAL CARE IF:  You notice a rapid heartbeat.  You feel weaker or more tired than usual.  You feel faint.  You notice increased bruising.  You feel your symptoms are not getting better in the time expected.  You believe you are having side effects of medicine. SEEK IMMEDIATE MEDICAL CARE IF:  You have chest pain.  You have trouble breathing.  You have new or increased swelling or pain in one leg.  You cough up blood.  You notice blood in vomit, in a bowel movement, or in urine. MAKE SURE YOU:  Understand these instructions.  Will watch your condition.  Will get help right away if you are not doing  well or get worse. Document Released: 03/18/2005 Document Revised: 08/02/2013 Document Reviewed: 11/23/2012 [facility] Patient Information 2015 [place] [place]. This information is not intended to replace advice given to you by your health care provider. Make sure you discuss any questions you have with your health care provider.

## 2014-04-10 NOTE — Progress Notes (Signed)
VASCULAR LAB PRELIMINARY  PRELIMINARY  PRELIMINARY  PRELIMINARY  Right lower extremity venous Doppler completed.    Preliminary report:  There is no DVT or SVT in the right lower extremity.   Altair Stanko, RVT 04/10/2014, 7:29 AM

## 2014-04-10 NOTE — ED Provider Notes (Signed)
CSN: [id number]     Arrival date & time 04/09/14  1531 History   First MD Initiated Contact with Patient 04/09/14 2014     Chief Complaint  Patient presents with  . Leg Pain     HPI  Patient presents upon referral from urgent care for possible DVT. Patient [redacted] weeks pregnant. She reports some discomfort down her leg  Past Medical History  Diagnosis Date  . [id number](478.2)    Past Surgical History  Procedure Laterality Date  . Intrauterine device (iud) insertion    . Hernia repair    . Dilation and curettage of uterus     Family History  Problem Relation Age of Onset  . Cancer Father   . Cancer Paternal Grandmother    History  Substance Use Topics  . Smoking status: Never Smoker   . Smokeless tobacco: Never Used  . Alcohol Use: Yes     Comment: Occasional   OB History    Gravida Para Term Preterm AB TAB SAB Ectopic Multiple Living   1              Review of Systems  Constitutional: Negative for fever, chills, diaphoresis, appetite change and fatigue.  HENT: Negative for mouth sores, sore throat and trouble swallowing.   Eyes: Negative for visual disturbance.  Respiratory: Negative for cough, chest tightness, shortness of breath and wheezing.   Cardiovascular: Negative for chest pain.  Gastrointestinal: Negative for nausea, vomiting, abdominal pain, diarrhea and abdominal distention.  Endocrine: Negative for polydipsia, polyphagia and polyuria.  Genitourinary: Negative for dysuria, frequency and hematuria.  Musculoskeletal: Positive for myalgias. Negative for gait problem.  Skin: Negative for color change, pallor and rash.  Neurological: Negative for dizziness, syncope, light-headedness and headaches.  Hematological: Does not bruise/bleed easily.  Psychiatric/Behavioral: Negative for behavioral problems and confusion.      Allergies  Peanuts and Pollen extract  Home Medications   Prior to Admission medications   Medication Sig Start Date End Date Taking?  Authorizing Provider  albuterol (PROVENTIL HFA;VENTOLIN HFA) 108 (90 BASE) MCG/ACT inhaler Inhale 2 puffs into the lungs every 6 (six) hours as needed for wheezing. 08/20/12 04/09/14 Yes Gillian Scarce, MD  budesonide-formoterol (SYMBICORT) 160-4.5 MCG/ACT inhaler Inhale 2 puffs into the lungs 2 (two) times daily. 08/20/12 04/09/14 Yes Gillian Scarce, MD  diphenhydrAMINE (BENADRYL) 25 mg capsule Take 25 mg by mouth at bedtime as needed for allergies or sleep.   Yes Historical Provider, MD  Docosahexaenoic Acid (PRENATAL DHA PO) Take 1 tablet by mouth every evening.    Yes Historical Provider, MD  cetirizine (ZYRTEC) 10 MG tablet Take 1 tablet (10 mg total) by mouth daily. Patient not taking: Reported on 04/09/2014 08/20/12 08/20/13  Gillian Scarce, MD  lisdexamfetamine (VYVANSE) 50 MG capsule Take 1 capsule (50 mg total) by mouth every morning. Patient not taking: Reported on 04/09/2014 11/12/12   Gillian Scarce, MD  mometasone (NASONEX) 50 MCG/ACT nasal spray Place 2 sprays into the nose daily. 08/20/12 08/20/13  Gillian Scarce, MD   BP 127/74 mmHg  Pulse 100  Temp(Src) 97.7 F (36.5 C) (Oral)  Resp 18  SpO2 100% Physical Exam  Constitutional: She is oriented to person, place, and time. She appears well-developed and well-nourished. No distress.  HENT:  Head: Normocephalic.  Eyes: Conjunctivae are normal. Pupils are equal, round, and reactive to light. No scleral icterus.  Neck: Normal range of motion. Neck supple. No thyromegaly present.  Cardiovascular: Normal rate and regular  rhythm.  Exam reveals no gallop and no friction rub.   No murmur heard. Pulmonary/Chest: Effort normal and breath sounds normal. No respiratory distress. She has no wheezes. She has no rales.  Abdominal: Soft. Bowel sounds are normal. She exhibits no distension. There is no tenderness. There is no rebound.  Musculoskeletal: Normal range of motion.  Right lower leg examined normally. No difference in circumference right versus  left no edema no cording or swelling erythema.  Neurological: She is alert and oriented to person, place, and time.  Skin: Skin is warm and dry. No rash noted.  Psychiatric: She has a normal mood and affect. Her behavior is normal.    ED Course  Procedures (including critical care time) Labs Review Labs Reviewed - No data to display  Imaging Review No results found.   EKG Interpretation None      MDM   Final diagnoses:  Pain of right lower extremity    Provider portable ultrasound is negative. Exam does not suggest DVT or cellulitis. A really benign exam. Plan is home. Tylenol. OB follow-up.    Rolland PorterMark Dior Dominik, MD 04/10/14 [phone]

## 2014-05-15 LAB — OB RESULTS CONSOLE HEPATITIS B SURFACE ANTIGEN: HEP B S AG: POSITIVE

## 2014-05-18 LAB — OB RESULTS CONSOLE ABO/RH: RH Type: POSITIVE

## 2014-05-18 LAB — OB RESULTS CONSOLE RPR
RPR: NONREACTIVE
RPR: REACTIVE

## 2014-05-18 LAB — OB RESULTS CONSOLE HIV ANTIBODY (ROUTINE TESTING)
HIV: NONREACTIVE
HIV: NONREACTIVE
HIV: NONREACTIVE
HIV: NONREACTIVE

## 2014-05-18 LAB — OB RESULTS CONSOLE HEPATITIS B SURFACE ANTIGEN: Hepatitis B Surface Ag: NEGATIVE

## 2014-05-18 LAB — OB RESULTS CONSOLE GC/CHLAMYDIA
CHLAMYDIA, DNA PROBE: NEGATIVE
Gonorrhea: NEGATIVE

## 2014-05-18 LAB — OB RESULTS CONSOLE RUBELLA ANTIBODY, IGM: Rubella: IMMUNE

## 2014-06-09 LAB — OB RESULTS CONSOLE RPR: RPR: NONREACTIVE

## 2014-08-02 LAB — OB RESULTS CONSOLE GBS: GBS: NEGATIVE

## 2014-08-09 ENCOUNTER — Encounter (HOSPITAL_COMMUNITY): Payer: Self-pay | Admitting: *Deleted

## 2014-08-09 ENCOUNTER — Inpatient Hospital Stay (HOSPITAL_COMMUNITY)
Admission: AD | Admit: 2014-08-09 | Discharge: 2014-08-12 | DRG: 775 | Disposition: A | Discharge status: Home / Self Care | Payer: 59 | Source: Ambulatory Visit | Attending: Obstetrics and Gynecology | Admitting: Obstetrics and Gynecology

## 2014-08-09 DIAGNOSIS — Z3A37 37 weeks gestation of pregnancy: Secondary | ICD-10-CM | POA: Diagnosis present

## 2014-08-09 DIAGNOSIS — Z3490 Encounter for supervision of normal pregnancy, unspecified, unspecified trimester: Secondary | ICD-10-CM

## 2014-08-09 DIAGNOSIS — O09523 Supervision of elderly multigravida, third trimester: Secondary | ICD-10-CM | POA: Diagnosis not present

## 2014-08-09 DIAGNOSIS — O4103X0 Oligohydramnios, third trimester, not applicable or unspecified: Secondary | ICD-10-CM | POA: Diagnosis present

## 2014-08-09 LAB — CBC
HCT: 33 % — ABNORMAL LOW (ref 36.0–46.0)
Hemoglobin: 11.7 g/dL — ABNORMAL LOW (ref 12.0–15.0)
MCH: 31 pg (ref 26.0–34.0)
MCHC: 35.5 g/dL (ref 30.0–36.0)
MCV: 87.3 fL (ref 78.0–100.0)
Platelets: 223 10*3/uL (ref 150–400)
RBC: 3.78 MIL/uL — ABNORMAL LOW (ref 3.87–5.11)
RDW: 14.6 % (ref 11.5–15.5)
WBC: 12.1 10*3/uL — ABNORMAL HIGH (ref 4.0–10.5)

## 2014-08-09 LAB — TYPE AND SCREEN
ABO/RH(D): A POS
Antibody Screen: NEGATIVE

## 2014-08-09 MED ORDER — FLEET ENEMA 7-19 GM/118ML RE ENEM: 1.0000 | ENEMA | RECTAL | Status: DC | PRN

## 2014-08-09 MED ORDER — OXYCODONE-ACETAMINOPHEN 5-325 MG PO TABS: 2.0000 | ORAL_TABLET | ORAL | Status: DC | PRN

## 2014-08-09 MED ORDER — CITRIC ACID-SODIUM CITRATE 334-500 MG/5ML PO SOLN: 30.0000 mL | ORAL | Status: DC | PRN

## 2014-08-09 MED ORDER — LIDOCAINE HCL (PF) 1 % IJ SOLN
30.0000 mL | INTRAMUSCULAR | Status: DC | PRN
  Filled 2014-08-09: qty 30

## 2014-08-09 MED ORDER — ACETAMINOPHEN 325 MG PO TABS: 650.0000 mg | ORAL_TABLET | ORAL | Status: DC | PRN

## 2014-08-09 MED ORDER — BUTORPHANOL TARTRATE 1 MG/ML IJ SOLN: 1.0000 mg | INTRAMUSCULAR | Status: DC | PRN

## 2014-08-09 MED ORDER — LACTATED RINGERS IV SOLN
INTRAVENOUS | Status: DC
  Administered 2014-08-09: 125 mL/h via INTRAVENOUS
  Administered 2014-08-10 (×3): via INTRAVENOUS

## 2014-08-09 MED ORDER — ONDANSETRON HCL 4 MG/2ML IJ SOLN: 4.0000 mg | Freq: Four times a day (QID) | INTRAMUSCULAR | Status: DC | PRN

## 2014-08-09 MED ORDER — OXYCODONE-ACETAMINOPHEN 5-325 MG PO TABS: 1.0000 | ORAL_TABLET | ORAL | Status: DC | PRN

## 2014-08-09 MED ORDER — ZOLPIDEM TARTRATE 5 MG PO TABS
5.0000 mg | ORAL_TABLET | Freq: Every evening | ORAL | Status: DC | PRN
  Administered 2014-08-09: 5 mg via ORAL
  Filled 2014-08-09: qty 1

## 2014-08-09 MED ORDER — MISOPROSTOL 25 MCG QUARTER TABLET
25.0000 ug | ORAL_TABLET | ORAL | Status: DC | PRN
  Administered 2014-08-09 – 2014-08-10 (×3): 25 ug via VAGINAL
  Filled 2014-08-09 (×2): qty 0.25
  Filled 2014-08-09: qty 1
  Filled 2014-08-09: qty 0.25
  Filled 2014-08-09: qty 1

## 2014-08-09 MED ORDER — OXYTOCIN 40 UNITS IN LACTATED RINGERS INFUSION - SIMPLE MED
62.5000 mL/h | INTRAVENOUS | Status: DC
  Administered 2014-08-10: 62.5 mL/h via INTRAVENOUS

## 2014-08-09 MED ORDER — TERBUTALINE SULFATE 1 MG/ML IJ SOLN: 0.2500 mg | Freq: Once | INTRAMUSCULAR | Status: AC | PRN

## 2014-08-09 MED ORDER — LACTATED RINGERS IV SOLN
500.0000 mL | INTRAVENOUS | Status: DC | PRN
  Administered 2014-08-10: 500 mL via INTRAVENOUS

## 2014-08-09 MED ORDER — OXYTOCIN BOLUS FROM INFUSION
500.0000 mL | INTRAVENOUS | Status: DC
  Administered 2014-08-10: 500 mL via INTRAVENOUS

## 2014-08-09 NOTE — H&P (Signed)
[name] is a 39 y.o. female  [phone] presenting for IOL 2nd to oligohydramnios. Pt underwent sono 5/3 where AFI was 1`0. Today AFI 4.5. Pt denies leakage of fluid (+) FM BPP 8/8 History OB History    Gravida Para Term Preterm AB TAB SAB Ectopic Multiple Living   7 3   3 2 1   3      Past Medical History  Diagnosis Date  . AOZHYQMV(784.6Headache(784.0)    Past Surgical History  Procedure Laterality Date  . Intrauterine device (iud) insertion    . Hernia repair    . Dilation and curettage of uterus     Family History: family history includes Cancer in her father and paternal grandmother. Social History:  reports that she has never smoked. She has never used smokeless tobacco. She reports that she drinks alcohol. She reports that she does not use illicit drugs.   Prenatal Transfer Tool  Maternal Diabetes: No Genetic Screening: Normal Maternal Ultrasounds/Referrals: Normal Fetal Ultrasounds or other Referrals:  None Maternal Substance Abuse:  No Significant Maternal Medications:  None Significant Maternal Lab Results:  Lab values include: Group B Strep negative Other Comments:  None  Review of Systems  All other systems reviewed and are negative.     Blood pressure 124/73, pulse 102, temperature 99.2 F (37.3 C), resp. rate 18, height 5\' 8"  (1.727 m), weight 111.585 kg (246 lb), unknown if currently breastfeeding. Maternal Exam:  Abdomen: Patient reports no abdominal tenderness. Fetal presentation: vertex  Introitus: Ferning test: not done.  Nitrazine test: not done. Amniotic fluid character: not assessed.  Cervix: Cervix evaluated by sterile speculum exam.     Physical Exam  Constitutional: She is oriented to person, place, and time. She appears well-developed.  Eyes: EOM are normal.  Neck: Neck supple.  Cardiovascular: Normal rate.   Respiratory: Effort normal.  GI: Soft.  Musculoskeletal: She exhibits edema.  Neurological: She is alert and oriented to person,  place, and time.  Skin: Skin is warm and dry.  Psychiatric: She has a normal mood and affect.   VE closed/soft/50/-2 post  Prenatal labs: ABO, Rh: A/Positive/-- (02/17 0000) Antibody:  negative Rubella: Immune (02/17 0000) RPR: Reactive (02/17 0000)  HBsAg: Negative (02/17 0000)  HIV: Non-reactive, Non-reactive (02/17 0000)  GBS: Negative (05/03 0000)   Assessment/Plan: Oligohydramnios AMA.>35 IUP@ 37 1/7 weeks P) admit  Routine labs. Cytotec. Pitocin in am   Tauna Macfarlane A 08/09/2014, 6:13 PM

## 2014-08-10 ENCOUNTER — Inpatient Hospital Stay (HOSPITAL_COMMUNITY): Payer: 59 | Admitting: Anesthesiology

## 2014-08-10 ENCOUNTER — Encounter (HOSPITAL_COMMUNITY): Payer: Self-pay | Admitting: *Deleted

## 2014-08-10 LAB — RPR: RPR Ser Ql: NONREACTIVE

## 2014-08-10 MED ORDER — FERROUS SULFATE 325 (65 FE) MG PO TABS
325.0000 mg | ORAL_TABLET | Freq: Two times a day (BID) | ORAL | Status: DC
  Administered 2014-08-11 – 2014-08-12 (×3): 325 mg via ORAL
  Filled 2014-08-10 (×3): qty 1

## 2014-08-10 MED ORDER — BENZOCAINE-MENTHOL 20-0.5 % EX AERO
1.0000 "application " | INHALATION_SPRAY | CUTANEOUS | Status: DC | PRN
  Administered 2014-08-11: 1 via TOPICAL
  Filled 2014-08-10: qty 56

## 2014-08-10 MED ORDER — ALBUTEROL SULFATE (2.5 MG/3ML) 0.083% IN NEBU: 3.0000 mL | INHALATION_SOLUTION | Freq: Four times a day (QID) | RESPIRATORY_TRACT | Status: DC | PRN

## 2014-08-10 MED ORDER — DIBUCAINE 1 % RE OINT
1.0000 "application " | TOPICAL_OINTMENT | RECTAL | Status: DC | PRN
  Administered 2014-08-11: 1 via RECTAL
  Filled 2014-08-10: qty 28

## 2014-08-10 MED ORDER — PRENATAL MULTIVITAMIN CH
1.0000 | ORAL_TABLET | Freq: Every day | ORAL | Status: DC
  Administered 2014-08-11: 1 via ORAL
  Filled 2014-08-10: qty 1

## 2014-08-10 MED ORDER — ACETAMINOPHEN 325 MG PO TABS: 650.0000 mg | ORAL_TABLET | ORAL | Status: DC | PRN

## 2014-08-10 MED ORDER — WITCH HAZEL-GLYCERIN EX PADS: 1.0000 "application " | MEDICATED_PAD | CUTANEOUS | Status: DC | PRN

## 2014-08-10 MED ORDER — SENNOSIDES-DOCUSATE SODIUM 8.6-50 MG PO TABS
2.0000 | ORAL_TABLET | ORAL | Status: DC
  Administered 2014-08-11 – 2014-08-12 (×2): 2 via ORAL
  Filled 2014-08-10 (×2): qty 2

## 2014-08-10 MED ORDER — ONDANSETRON HCL 4 MG/2ML IJ SOLN: 4.0000 mg | INTRAMUSCULAR | Status: DC | PRN

## 2014-08-10 MED ORDER — IBUPROFEN 600 MG PO TABS
600.0000 mg | ORAL_TABLET | Freq: Four times a day (QID) | ORAL | Status: DC
  Administered 2014-08-11 – 2014-08-12 (×6): 600 mg via ORAL
  Filled 2014-08-10 (×6): qty 1

## 2014-08-10 MED ORDER — DIPHENHYDRAMINE HCL 25 MG PO CAPS: 25.0000 mg | ORAL_CAPSULE | Freq: Four times a day (QID) | ORAL | Status: DC | PRN

## 2014-08-10 MED ORDER — OXYCODONE-ACETAMINOPHEN 5-325 MG PO TABS: 1.0000 | ORAL_TABLET | ORAL | Status: DC | PRN

## 2014-08-10 MED ORDER — TETANUS-DIPHTH-ACELL PERTUSSIS 5-2.5-18.5 LF-MCG/0.5 IM SUSP: 0.5000 mL | Freq: Once | INTRAMUSCULAR | Status: DC

## 2014-08-10 MED ORDER — EPHEDRINE 5 MG/ML INJ
10.0000 mg | INTRAVENOUS | Status: DC | PRN
  Filled 2014-08-10: qty 2

## 2014-08-10 MED ORDER — OXYTOCIN 40 UNITS IN LACTATED RINGERS INFUSION - SIMPLE MED
1.0000 m[IU]/min | INTRAVENOUS | Status: DC
  Administered 2014-08-10: 2 m[IU]/min via INTRAVENOUS
  Filled 2014-08-10: qty 1000

## 2014-08-10 MED ORDER — TERBUTALINE SULFATE 1 MG/ML IJ SOLN
0.2500 mg | Freq: Once | INTRAMUSCULAR | Status: DC | PRN
  Filled 2014-08-10: qty 1

## 2014-08-10 MED ORDER — MAGNESIUM OXIDE 400 (241.3 MG) MG PO TABS
200.0000 mg | ORAL_TABLET | Freq: Every day | ORAL | Status: DC
  Administered 2014-08-11 – 2014-08-12 (×2): 200 mg via ORAL
  Filled 2014-08-10 (×2): qty 0.5

## 2014-08-10 MED ORDER — PHENYLEPHRINE 40 MCG/ML (10ML) SYRINGE FOR IV PUSH (FOR BLOOD PRESSURE SUPPORT)
80.0000 ug | PREFILLED_SYRINGE | INTRAVENOUS | Status: DC | PRN
  Filled 2014-08-10: qty 2
  Filled 2014-08-10: qty 20

## 2014-08-10 MED ORDER — FENTANYL 2.5 MCG/ML BUPIVACAINE 1/10 % EPIDURAL INFUSION (WH - ANES): 14.0000 mL/h | INTRAMUSCULAR | Status: DC | PRN

## 2014-08-10 MED ORDER — SIMETHICONE 80 MG PO CHEW: 80.0000 mg | CHEWABLE_TABLET | ORAL | Status: DC | PRN

## 2014-08-10 MED ORDER — FENTANYL 2.5 MCG/ML BUPIVACAINE 1/10 % EPIDURAL INFUSION (WH - ANES)
14.0000 mL/h | INTRAMUSCULAR | Status: DC | PRN
  Administered 2014-08-10: 14 mL/h via EPIDURAL
  Filled 2014-08-10: qty 125

## 2014-08-10 MED ORDER — ZOLPIDEM TARTRATE 5 MG PO TABS: 5.0000 mg | ORAL_TABLET | Freq: Every evening | ORAL | Status: DC | PRN

## 2014-08-10 MED ORDER — DIPHENHYDRAMINE HCL 50 MG/ML IJ SOLN
12.5000 mg | INTRAMUSCULAR | Status: DC | PRN
  Administered 2014-08-10: 12.5 mg via INTRAVENOUS
  Filled 2014-08-10: qty 1

## 2014-08-10 MED ORDER — OXYCODONE-ACETAMINOPHEN 5-325 MG PO TABS: 2.0000 | ORAL_TABLET | ORAL | Status: DC | PRN

## 2014-08-10 MED ORDER — LIDOCAINE HCL (PF) 1 % IJ SOLN
INTRAMUSCULAR | Status: DC | PRN
  Administered 2014-08-10: 4 mL
  Administered 2014-08-10: 6 mL

## 2014-08-10 MED ORDER — ONDANSETRON HCL 4 MG PO TABS: 4.0000 mg | ORAL_TABLET | ORAL | Status: DC | PRN

## 2014-08-10 MED ORDER — BUDESONIDE-FORMOTEROL FUMARATE 160-4.5 MCG/ACT IN AERO
2.0000 | INHALATION_SPRAY | Freq: Two times a day (BID) | RESPIRATORY_TRACT | Status: DC
  Filled 2014-08-10: qty 6

## 2014-08-10 NOTE — Progress Notes (Signed)
Patient ID: Sherrie SportSharna Dixon Mehan, female   DOB: Feb 27, 1976, 39 y.o.   MRN: [id number] Request from Dr. Cherly Hensenousins to examine patient and replace cervical balloon that has prematurely fallen out of cervix.  S: Doing well, not feeling pain from contractions. Went to the BR and had increased vaginal pressure like the balloon was sitting in vagina.   She pushed a little to get the cervical balloon to come out. Planning an epidural for pain management.    Ceasar Mons: Filed Vitals:   08/10/14 0754 08/10/14 0938 08/10/14 1008 08/10/14 1044  BP: 118/66 116/69 118/72 103/61  Pulse: 72 88 97 96  Temp: 98.7 F (37.1 C)     TempSrc: Oral     Resp: 24 20 24 20   Height:      Weight:         FHT:  FHR: 145 bpm, variability: moderate,  accelerations:  Present,  decelerations:  Absent UC:   regular, every 2-5 minutes SVE:   Dilation: 4 Effacement (%): 80-90 Station: -1 / BBOW Exam by: Carloyn Jaeger. Keontae Levingston CNM    A / P: Induction of labor due to Oligohydramnios,  progressing well on pitocin  Fetal Wellbeing:  Category I Pain Control:  Epidural  Anticipated MOD:  NSVD  Raelyn MoraAWSON, Maddy Graham, Judie PetitM MSN, CNM 08/10/2014, 11:02 AM

## 2014-08-10 NOTE — Anesthesia Preprocedure Evaluation (Signed)
Anesthesia Evaluation  Patient identified by MRN, date of birth, ID band Patient awake    Reviewed: Allergy & Precautions, H&P , Patient's Chart, lab work & pertinent test results  Airway Mallampati: II  TM Distance: >3 FB Neck ROM: full    Dental  (+) Teeth Intact   Pulmonary  breath sounds clear to auscultation        Cardiovascular Rhythm:regular Rate:Normal     Neuro/Psych    GI/Hepatic GERD-  ,  Endo/Other  Morbid obesity  Renal/GU      Musculoskeletal   Abdominal   Peds  Hematology   Anesthesia Other Findings       Reproductive/Obstetrics (+) Pregnancy                             Anesthesia Physical Anesthesia Plan  ASA: III  Anesthesia Plan: Epidural   Post-op Pain Management:    Induction:   Airway Management Planned:   Additional Equipment:   Intra-op Plan:   Post-operative Plan:   Informed Consent: I have reviewed the patients History and Physical, chart, labs and discussed the procedure including the risks, benefits and alternatives for the proposed anesthesia with the patient or authorized representative who has indicated his/her understanding and acceptance.   Dental Advisory Given  Plan Discussed with:   Anesthesia Plan Comments: (Labs checked- platelets confirmed with RN in room. Fetal heart tracing, per RN, reported to be stable enough for sitting procedure. Discussed epidural, and patient consents to the procedure:  included risk of possible headache,backache, failed block, allergic reaction, and nerve injury. This patient was asked if she had any questions or concerns before the procedure started.)        Anesthesia Quick Evaluation

## 2014-08-10 NOTE — Progress Notes (Signed)
S: comfortable  O: epidural Pitocin 14 miu VE 4/60/-3  AROM scant clear fluid IUPC/ISE placed  Tracing: baseline 150 (+) acc (+) ctx q 2-3 mins  IMP: Arrest of dilation  Oligohydramnios P) cont pitocin. Left exaggerated sims position

## 2014-08-10 NOTE — Anesthesia Procedure Notes (Signed)
Epidural Patient location during procedure: OB  Preanesthetic Checklist Completed: patient identified, site marked, surgical consent, pre-op evaluation, timeout performed, IV checked, risks and benefits discussed and monitors and equipment checked  Epidural Patient position: sitting Prep: site prepped and draped and DuraPrep Patient monitoring: continuous pulse ox and blood pressure Approach: midline Location: L3-L4 Injection technique: LOR air  Needle:  Needle type: Tuohy  Needle gauge: 17 G Needle length: 9 cm and 9 Needle insertion depth: 6 cm Catheter type: closed end flexible Catheter size: 19 Gauge Catheter at skin depth: 13 cm Test dose: negative  Assessment Events: blood not aspirated, injection not painful, no injection resistance, negative IV test and no paresthesia  Additional Notes Dosing of Epidural:  1st dose, through catheter .............................................  Xylocaine 40 mg  2nd dose, through catheter, after waiting 3 minutes.........Xylocaine 60 mg    ( 1% Xylo charted as a single dose in Epic Meds for ease of charting; actual dosing was fractionated as above, for saftey's sake)  As each dose occurred, patient was free of IV sx; and patient exhibited no evidence of SA injection.  Patient is more comfortable after epidural dosed. Please see RN's note for documentation of vital signs,and FHR which are stable.  Patient reminded not to try to ambulate with numb legs, and that an RN must be present when she attempts to get up.       

## 2014-08-10 NOTE — Progress Notes (Signed)
S: notes some cramping  O:  Filed Vitals:   08/09/14 2229 08/10/14 0246 08/10/14 0247 08/10/14 0754  BP: 119/68  103/60 118/66  Pulse: 101  92 72  Temp:  98.5 F (36.9 C)  98.7 F (37.1 C)  TempSrc:  Oral  Oral  Resp: 18 18  24   Height:      Weight:       VE: 1.5/50/-3  Tracing: baseline 140  irreg ctx  IMP: oligohydramnios IUP @ 37 + P) start pitocin. Intracervical balloon placed

## 2014-08-11 LAB — CBC
HEMATOCRIT: 32.7 % — AB (ref 36.0–46.0)
HEMOGLOBIN: 11.5 g/dL — AB (ref 12.0–15.0)
MCH: 30.7 pg (ref 26.0–34.0)
MCHC: 35.2 g/dL (ref 30.0–36.0)
MCV: 87.2 fL (ref 78.0–100.0)
Platelets: 208 10*3/uL (ref 150–400)
RBC: 3.75 MIL/uL — AB (ref 3.87–5.11)
RDW: 14.6 % (ref 11.5–15.5)
WBC: 14.3 10*3/uL — AB (ref 4.0–10.5)

## 2014-08-11 NOTE — Anesthesia Postprocedure Evaluation (Signed)
Anesthesia Post Note  Patient: [name]  Procedure(s) Performed: * No procedures listed *  Anesthesia type: Epidural  Patient location: Mother/Baby  Post pain: Pain level controlled  Post assessment: Post-op Vital signs reviewed  Last Vitals:  Filed Vitals:   08/11/14 0618  BP: 128/79  Pulse: 85  Temp: 36.9 C  Resp: 19    Post vital signs: Reviewed  Level of consciousness: awake  Complications: No apparent anesthesia complications

## 2014-08-11 NOTE — Lactation Note (Signed)
This note was copied from the chart of Joyce Hettie HolsteinSharna Newport. Lactation Consultation Note  Initial visit made.  Breastfeeding consultation services and support information given to patient.  Mom states she breastfed her previous babies.  Mom has been having difficulty latching baby and gave formula this AM.  Baby is sleepy but mom would like to attempt latching the baby.  Mom has large nipples and tissue is thick.  Attempted to latch baby several times but baby unable to sustain latch.  Colostrum is easily expressed.  Manual pump given to mom with instructions to pre pump a few minutes.  24 mm nipple shield applied and baby was able to sustain latch better.  Baby sucked on and off for 10 minutes but very sleepy.  Instructed mom to watch for feeding cues and call for assist prn.  Patient Name: [name] ZOXWR'UToday's Date: 08/11/2014 Reason for consult: Initial assessment;Difficult latch   Maternal Data    Feeding Feeding Type: Breast Fed Length of feed: 10 min  LATCH Score/Interventions Latch: Repeated attempts needed to sustain latch, nipple held in mouth throughout feeding, stimulation needed to elicit sucking reflex. Intervention(s): Skin to skin;Teach feeding cues;Waking techniques Intervention(s): Breast compression;Breast massage;Assist with latch;Adjust position  Audible Swallowing: A few with stimulation Intervention(s): Skin to skin;Hand expression;Alternate breast massage  Type of Nipple: Everted at rest and after stimulation  Comfort (Breast/Nipple): Soft / non-tender     Hold (Positioning): Assistance needed to correctly position infant at breast and maintain latch.  LATCH Score: 7  Lactation Tools Discussed/Used     Consult Status Consult Status: Follow-up Date: 08/12/14 Follow-up type: In-patient    Huston FoleyMOULDEN, [name] 08/11/2014, 12:17 PM

## 2014-08-12 MED ORDER — IBUPROFEN 200 MG PO TABS: 600.0000 mg | ORAL_TABLET | Freq: Four times a day (QID) | ORAL | Status: AC | PRN

## 2014-08-12 NOTE — Lactation Note (Signed)
This note was copied from the chart of Girl Hettie HolsteinSharna Blakeley. Lactation Consultation Note  Patient Name: Girl Hettie HolsteinSharna Kathan ZOXWR'UToday's Date: 08/12/2014 Reason for consult: Follow-up assessment Mom recently supplemented baby 60 ml of formula. Mom reports baby has been nursing without the nipple shield but she does not report hearing swallows. Mom wanted to attempt to latch baby, baby very sleepy. With trying without nipple shield baby was shallow with few suckles, with nipple shield baby did not obtain good depth. Mom has large nipples, baby has small mouth. Reviewed with Mom supplemental guidelines, hand out given. Baby at 7% weight loss, has had 5 voids/5 stools in 24 hours with 8 voids in life and 7 stools in life.  LC advised Mom to be sure to BF with each feeding without nipple shield. Reviewed how to assess for deep latch, listen for swallows. Post pump after feedings and give baby back EBM she receives. If baby not satisfied with feedings or inadequate void/stool,  use formula as needed per guidelines given. Monitor voids/stools. Mom had DEBP at home. Engorgement care reviewed if needed. Discussed importance of deep latch to milk transfer and protecting milk supply. Encouraged Mom to schedule OP follow up next week. She will advise. Encouraged to call for latch check if baby wakens and will BF before d/c home. Advised of support group.   Maternal Data    Feeding Feeding Type: Breast Fed Length of feed: 0 min  LATCH Score/Interventions Latch: Too sleepy or reluctant, no latch achieved, no sucking elicited.     Type of Nipple: Everted at rest and after stimulation Intervention(s): Hand pump        Intervention(s): Breastfeeding basics reviewed;Support Pillows;Position options;Skin to skin     Lactation Tools Discussed/Used Tools: Nipple Dorris CarnesShields;Pump Nipple shield size: 24 Breast pump type: Manual   Consult Status Consult Status: Complete Date: 08/12/14 Follow-up type:  In-patient    Alfred LevinsGranger, Avari Nevares Ann 08/12/2014, 11:13 AM

## 2014-08-12 NOTE — Discharge Summary (Signed)
Obstetric Discharge Summary Reason for Admission: induction of labor and oligohydramnios Prenatal Procedures: AMA Intrapartum Procedures: spontaneous vaginal delivery Postpartum Procedures: none Complications-Operative and Postpartum: none HEMOGLOBIN  Date Value Ref Range Status  08/11/2014 11.5* 12.0 - 15.0 g/dL Final   HCT  Date Value Ref Range Status  08/11/2014 32.7* 36.0 - 46.0 % Final    Physical Exam:  General: alert and cooperative Lochia: appropriate Uterine Fundus: firm Perineum: intact, no edema, contusion, or erythema DVT Evaluation: No evidence of DVT seen on physical exam. Negative Homan's sign. No cords or calf tenderness. No significant calf/ankle edema.  Discharge Diagnoses: Term Pregnancy-delivered  Discharge Information: Date: 08/12/2014 Activity: pelvic rest Diet: routine Medications: PNV and Ibuprofen Condition: stable Instructions: refer to practice specific booklet Discharge to: home Follow-up Information    Follow up with Steen Bisig A, MD. Schedule an appointment as soon as possible for a visit in 6 weeks.   Specialty:  Obstetrics and Gynecology   Contact information:   [address] [place] [id number] [phone]       Newborn Data: Live born female on 08/10/14 Birth Weight: 6 lb 13 oz (3090 g) APGAR: 8, 9  Home with mother.  BHAMBRI, MELANIE, N 08/12/2014, 10:55 AM

## 2014-08-12 NOTE — Progress Notes (Signed)
PPD #2- SVD  Subjective:   Reports feeling well, ready for discharge Tolerating po/ No nausea or vomiting Bleeding is light Pain controlled with Motrin Up ad lib / ambulatory / voiding without problems Newborn: breastfeeding     Objective:   VS: VS:  Filed Vitals:   08/10/14 2000 08/11/14 0618 08/11/14 1745 08/12/14 0641  BP: 117/76 128/79 120/72 114/93  Pulse: 94 85 76 79  Temp: 99.1 F (37.3 C) 98.5 F (36.9 C) 97.7 F (36.5 C) 98.3 F (36.8 C)  TempSrc: Oral Oral Oral Oral  Resp: 20 19 19 18   Height:      Weight:      SpO2: 98% 99%  99%    LABS:  Recent Labs  08/09/14 1715 08/11/14 0615  WBC 12.1* 14.3*  HGB 11.7* 11.5*  PLT 223 208   Blood type: --/--/A POS (05/10 1715) Rubella: Immune (02/17 0000)                I&O: Intake/Output      05/12 0701 - 05/13 0700 05/13 0701 - 05/14 0700   Blood     Total Output       Net              Physical Exam: Alert and oriented X3 Abdomen: soft, non-tender, non-distended  Fundus: firm, non-tender, U-2 Perineum: intact, no edema, contusion, or erythema Lochia: small Extremities: no edema, no calf pain or tenderness    Assessment: PPD #2  [id number]/ S/P:induced vaginal Doing well - stable for discharge home   Plan: Discharge home RX's:  Ibuprofen 600mg  po Q 6 hrs prn pain #30 Refill x 0 Routine pp visit in [facility] booklet given    Donette LarryBHAMBRI, Bali Lyn, N MSN, CNM 08/12/2014, 10:38 AM

## 2015-11-06 ENCOUNTER — Ambulatory Visit (HOSPITAL_COMMUNITY)
Admission: EM | Admit: 2015-11-06 | Discharge: 2015-11-06 | Disposition: A | Discharge status: Home / Self Care | Payer: 59 | Attending: Emergency Medicine | Admitting: Emergency Medicine

## 2015-11-06 ENCOUNTER — Encounter (HOSPITAL_COMMUNITY): Payer: Self-pay | Admitting: Emergency Medicine

## 2015-11-06 DIAGNOSIS — J069 Acute upper respiratory infection, unspecified: Secondary | ICD-10-CM

## 2015-11-06 MED ORDER — IPRATROPIUM BROMIDE 0.06 % NA SOLN: 2.0000 | Freq: Four times a day (QID) | NASAL | 0 refills | Status: DC

## 2015-11-06 NOTE — Discharge Instructions (Signed)
You caught a nasty virus. Take Zyrtec daily to help with the drainage. Use Atrovent nasal spray 4 times a day as needed for congestion and runny nose. You can take Tylenol or ibuprofen as needed for body aches or headaches. This should gradually improve over the next 3 days. Follow-up as needed.

## 2015-11-06 NOTE — ED Triage Notes (Signed)
[facility] a [facility] headache, itchy throat and a runny nose x 2 days.

## 2015-11-06 NOTE — ED Provider Notes (Signed)
[facility]    CSN: [id number] Arrival date & time: 11/06/15  1511  First Provider Contact:  First MD Initiated Contact with Patient 11/06/15 1620        History   Chief Complaint Chief Complaint  Patient presents with  . Headache    HPI [name] is a 40 y.o. female.   She is a 40 year old woman here for evaluation of headache and body aches. She states over the weekend she has had chills, body aches, subjective fevers, headaches, and nasal congestion. She also reports some throat irritation and postnasal drainage. No cough or shortness of breath. No ear pain. She states she actually feels a little bit better today than yesterday. She has tried OTC Benadryl without much improvement.    Past Medical History:  Diagnosis Date  . Headache(784.0)   . Postpartum care following vaginal delivery (5/11) 08/10/2014    Patient Active Problem List   Diagnosis Date Noted  . Postpartum care following vaginal delivery (5/11) 08/10/2014  . Pregnancy 08/09/2014  . Concentration deficit 12/27/2012  . Anxiety state, unspecified 12/27/2012  . GERD (gastroesophageal reflux disease) 09/05/2012  . Seasonal allergies 09/05/2012  . Shortness of breath 09/05/2012  . Cough 09/05/2012    Past Surgical History:  Procedure Laterality Date  . DILATION AND CURETTAGE OF UTERUS    . HERNIA REPAIR    . INTRAUTERINE DEVICE (IUD) INSERTION      OB History    Gravida Para Term Preterm AB Living   SAB TAB Ectopic Multiple Live Births   1 2   0 4       Home Medications    Prior to Admission medications   Medication Sig Start Date End Date Taking? Authorizing Provider  Prenatal Vit-Fe Fumarate-FA (PRENATAL MULTIVITAMIN) TABS tablet Take 1 tablet by mouth at bedtime.   Yes Historical Provider, MD  albuterol (PROVENTIL HFA;VENTOLIN HFA) 108 (90 BASE) MCG/ACT inhaler Inhale 2 puffs into the lungs every 6 (six) hours as needed for wheezing or shortness of breath.     Historical Provider, MD  Docosahexaenoic Acid (PRENATAL DHA PO) Take 1 tablet by mouth every evening.     Historical Provider, MD  ibuprofen (ADVIL,MOTRIN) 200 MG tablet Take 3 tablets (600 mg total) by mouth every 6 (six) hours as needed. 08/12/14   Donette Larry, CNM  ipratropium (ATROVENT) 0.06 % nasal spray Place 2 sprays into both nostrils 4 (four) times daily. 11/06/15   Charm Rings, MD    Family History Family History  Problem Relation Age of Onset  . Cancer Father   . Cancer Paternal Grandmother     Social History Social History  Substance Use Topics  . Smoking status: Never Smoker  . Smokeless tobacco: Never Used  . Alcohol use Yes     Comment: Occasional     Allergies   Peanuts [peanut oil] and Pollen extract   Review of Systems Review of Systems  Constitutional: Positive for fever (subjective).  HENT: Positive for congestion, postnasal drip, rhinorrhea and sore throat. Negative for ear pain and trouble swallowing.   Respiratory: Negative for cough and shortness of breath.   Gastrointestinal: Negative for nausea and vomiting.  Musculoskeletal: Positive for myalgias.  Neurological: Positive for headaches.     Physical Exam Triage Vital Signs ED Triage Vitals  Enc Vitals Group     BP 11/06/15 1607 129/82     Pulse Rate 11/06/15 1607 91  Resp 11/06/15 1607 20     Temp 11/06/15 1607 99.6 F (37.6 C)     Temp Source 11/06/15 1607 Oral     SpO2 11/06/15 1607 100 %     Weight --      Height --      Head Circumference --      Peak Flow --      Pain Score 11/06/15 1613 0     Pain Loc --      Pain Edu? --      Excl. in GC? --    No data found.   Updated Vital Signs BP 129/82 (BP Location: Left Arm)   Pulse 91   Temp 99.6 F (37.6 C) (Oral)   Resp 20   LMP 10/29/2015 (Exact Date)   SpO2 100%   Visual Acuity Right Eye Distance:   Left Eye Distance:   Bilateral Distance:    Right Eye Near:   Left Eye Near:    Bilateral Near:      Physical Exam  Constitutional: She is oriented to person, place, and time. She appears well-developed and well-nourished. No distress.  HENT:  Mouth/Throat: No oropharyngeal exudate.  Nasal mucosa is edematous. Postnasal drainage seen.  Eyes: Conjunctivae are normal.  Neck: Neck supple.  Cardiovascular: Normal rate, regular rhythm and normal heart sounds.   No murmur heard. Pulmonary/Chest: Effort normal and breath sounds normal. No respiratory distress. She has no wheezes. She has no rales.  Lymphadenopathy:    She has no cervical adenopathy.  Neurological: She is alert and oriented to person, place, and time.     UC Treatments / Results  Labs (all labs ordered are listed, but only abnormal results are displayed) Labs Reviewed - No data to display  EKG  EKG Interpretation None       Radiology No results found.  Procedures Procedures (including critical care time)  Medications Ordered in UC Medications - No data to display   Initial Impression / Assessment and Plan / UC Course  I have reviewed the triage vital signs and the nursing notes.  Pertinent labs & imaging results that were available during my care of the patient were reviewed by me and considered in my medical decision making (see chart for details).  Clinical Course    Discussed that this is a viral process and should gradually improve over the next several days. Recommended Zyrtec daily to help with drainage. Tylenol or ibuprofen as needed for headaches and body aches. Atrovent nasal spray to help with nasal symptoms. Follow-up as needed.  Final Clinical Impressions(s) / UC Diagnoses   Final diagnoses:  Viral URI    New Prescriptions Discharge Medication List as of 11/06/2015  4:47 PM    START taking these medications   Details  ipratropium (ATROVENT) 0.06 % nasal spray Place 2 sprays into both nostrils 4 (four) times daily., Starting Mon 11/06/2015, Normal         Charm RingsErin J Zebediah Beezley,  MD 11/06/15 [id number]

## 2016-08-19 ENCOUNTER — Encounter (HOSPITAL_COMMUNITY): Payer: Self-pay | Admitting: Emergency Medicine

## 2016-08-19 DIAGNOSIS — Z5321 Procedure and treatment not carried out due to patient leaving prior to being seen by health care provider: Secondary | ICD-10-CM | POA: Diagnosis not present

## 2016-08-19 DIAGNOSIS — R51 Headache: Secondary | ICD-10-CM | POA: Insufficient documentation

## 2016-08-19 NOTE — ED Triage Notes (Signed)
Patient c/o frontal lobe headache and nausea for about a week. States an episode of emesis last week after eating. Pt tolerating fluids. Pt states she did some googling and believes her potassium may be low so took a supplement today.

## 2016-08-20 ENCOUNTER — Emergency Department (HOSPITAL_COMMUNITY)
Admission: EM | Admit: 2016-08-20 | Discharge: 2016-08-20 | Disposition: A | Discharge status: Home / Self Care | Payer: 59 | Attending: Dermatology | Admitting: Dermatology

## 2017-02-03 ENCOUNTER — Other Ambulatory Visit: Payer: Self-pay | Admitting: Family Medicine

## 2017-02-03 DIAGNOSIS — G4459 Other complicated headache syndrome: Secondary | ICD-10-CM

## 2017-02-13 ENCOUNTER — Ambulatory Visit
Admission: RE | Admit: 2017-02-13 | Discharge: 2017-02-13 | Disposition: A | Discharge status: Home / Self Care | Payer: 59 | Source: Ambulatory Visit | Attending: Family Medicine | Admitting: Family Medicine

## 2017-02-13 DIAGNOSIS — G4459 Other complicated headache syndrome: Secondary | ICD-10-CM

## 2017-02-13 MED ORDER — GADOBENATE DIMEGLUMINE 529 MG/ML IV SOLN
20.0000 mL | Freq: Once | INTRAVENOUS | Status: AC | PRN
  Administered 2017-02-13: 20 mL via INTRAVENOUS

## 2018-07-06 ENCOUNTER — Other Ambulatory Visit: Payer: Self-pay

## 2018-07-06 ENCOUNTER — Encounter: Payer: Self-pay | Admitting: Allergy & Immunology

## 2018-07-06 ENCOUNTER — Ambulatory Visit: Payer: 59 | Admitting: Allergy & Immunology

## 2018-07-06 VITALS — BP 100/70 | HR 113 | Temp 98.1°F | Ht 60.2 in | Wt 222.0 lb

## 2018-07-06 DIAGNOSIS — J4520 Mild intermittent asthma, uncomplicated: Secondary | ICD-10-CM

## 2018-07-06 DIAGNOSIS — L508 Other urticaria: Secondary | ICD-10-CM | POA: Diagnosis not present

## 2018-07-06 DIAGNOSIS — J310 Chronic rhinitis: Secondary | ICD-10-CM

## 2018-07-06 MED ORDER — METHYLPREDNISOLONE ACETATE 80 MG/ML IJ SUSP
80.0000 mg | Freq: Once | INTRAMUSCULAR | Status: AC
  Administered 2018-07-06: 80 mg via INTRAMUSCULAR

## 2018-07-06 NOTE — Patient Instructions (Addendum)
1. Chronic urticaria - Your history does not have any "red flags" such as fevers, joint pains, or permanent skin changes that would be concerning for a more serious cause of hives.  - We will get some labs to rule out serious causes of hives: complete blood count, tryptase level, chronic urticaria panel, CMP, ESR, and CRP. - Chronic hives are often times a self limited process and will "burn themselves out" over 6-12 months, although this is not always the case.  - We are going to give you a steroid injection (DepoMedrol 68m) followed by a prednisone taper.  - In the meantime, start suppressive dosing of antihistamines:   - Morning: Allegra (fexofenadine) 3667m(two tablets)  - Evening: Xyzal (levocetirizine) 1034mtwo tablets) + Singulair (montelukast) 7m35mghtly - You can change this dosing at home, decreasing the dos0e as needed or increasing the dosing as needed.  - If you are not tolerating the medications or are tired of taking them every day, we can start treatment with a monthly injectable medication called Xolair.  2. Return in about 6 weeks (around 08/17/2018). This can be an in-person, a virtual Webex or a telephone follow up visit.   Please inform us o[place]any Emergency Department visits, hospitalizations, or changes in symptoms. Call us b[place]oreaore going to the ED for breathing or allergy symptoms since we might be able to fit you in for a sick visit. Feel free to contact us a[place]atime with any questions, problems, or concerns.  It was a pleasure to meet you today!  Websites that have reliable patient information: 1. American Academy of Asthma, Allergy, and Immunology: www.aaaai.org 2. Food Allergy Research and Education (FARE): foodallergy.org 3. Mothers of Asthmatics: http://www.asthmacommunitynetwork.org 4. American College of Allergy, Asthma, and Immunology: www.acaai.org  "Like" us o[place]Facebook and Instagram for our latest updates!      Make sure you are registered to vote! If you  have moved or changed any of your contact information, you will need to get this updated before voting!    Voter ID laws are NOT going into effect for the General Election in November 2020! DO NOT let this stop you from exercising your right to vote!

## 2018-07-06 NOTE — Addendum Note (Signed)
Addended by: Florence Canner on: 07/06/2018 01:58 PM   Modules accepted: Orders

## 2018-07-06 NOTE — Progress Notes (Signed)
NEW PATIENT  Date of Service/Encounter:  07/06/18  Referring provider: Jonathon Resides, MD   Assessment:   Chronic urticaria  Mild intermittent asthma, uncomplicated  Chronic rhinitis  Plan/Recommendations:   1. Chronic urticaria - Your history does not have any "red flags" such as fevers, joint pains, or permanent skin changes that would be concerning for a more serious cause of hives.  - We will get some labs to rule out serious causes of hives: complete blood count, tryptase level, chronic urticaria panel, CMP, ESR, and CRP. - Chronic hives are often times a self limited process and will "burn themselves out" over 6-12 months, although this is not always the case.  - We are going to give you a steroid injection (DepoMedrol 52m) followed by a prednisone taper.  - In the meantime, start suppressive dosing of antihistamines:   - Morning: Allegra (fexofenadine) 3645m(two tablets)  - Evening: Xyzal (levocetirizine) 1069mtwo tablets) + Singulair (montelukast) 22m47mghtly - You can change this dosing at home, decreasing the dos0e as needed or increasing the dosing as needed.  - If you are not tolerating the medications or are tired of taking them every day, we can start treatment with a monthly injectable medication called Xolair.  2. Intermittent asthma, uncomplicated - Continue with albuterol two puffs every 4-6 hours as needed. - There does not seem to be a need for a controller medication at this time.  3. Chronic rhinitis - We will have some more information regarding avoidance measures once we have the labs back.  - In the interim, the antihistamines that we are starting for her hives - as well as the montelukast - will help control any rhinitis symptoms.   4. Return in about 6 weeks (around 08/17/2018). This can be an in-person, a virtual Webex or a telephone follow up visit.   Subjective:   [name] 42 y14. female presenting today for evaluation of   Chief Complaint  Patient presents with  . Urticaria    two months all over body    Lowella DixoKarl Ito a history of the following: Patient Active Problem List   Diagnosis Date Noted  . Postpartum care following vaginal delivery (5/11) 08/10/2014  . Pregnancy 08/09/2014  . Concentration deficit 12/27/2012  . Anxiety state, unspecified 12/27/2012  . GERD (gastroesophageal reflux disease) 09/05/2012  . Seasonal allergies 09/05/2012  . Shortness of breath 09/05/2012  . Cough 09/05/2012    History obtained from: chart review and patient.  [name] referred by ZanaJonathon Resides.     [name] 42 y24. female presenting for an evaluation of urticaria.   She is followed by Dr. RobyIval Bibleer last visit was today. She was started on Pepcid 20 mg twice daily, Xyzal 5 mg daily, Allegra 180 mg daily, and clobetasol ointment as needed.  She has a history of chronic urticaria.  She has had symptoms for around 2 months.  She seems to think that this is related to her diet.  She has been trying to eat healthier [employer] fruit and vegetable based diet.  However, some of these episodes occur in the middle of the night at 4 AM. She has not associated symptoms including diarrhea, abdominal pain, throat swelling, or wheezing.   She does eat all of the major food allergens without adverse event. She is not much of a red meat eater. In fact, she has not eaten red meat  in over one year. She is good about talking to restaurant staff about this avoidance as well. She does drink cow's milk and eats yogurt and cheese, however.   These lesions occur over any part of her body. She does use antihistamine which do take the edge off. She has been on prednisone in the past (67m daily) for a period of time, but this did not resolve the symptoms completely. She has not had joint pain or fever with these urticaria.  She does have a history of allergic rhinitis. She has been tested in our  practice in the past, but she does not remember the exact results. She was food tested at that time, which were positive according to the patient. But again she does not remember what was positive. She was not having hives at that time, as this is a new symptom for her.   She does have a tentative diagnosis of asthma. She does have an inhaler but she never uses it. She has never needed a hospitalization or intubation for her symptoms. She has never been on a controller medication for her symptoms.    Otherwise, there is no history of other atopic diseases, including drug allergies, stinging insect allergies, eczema or contact dermatitis. There is no significant infectious history. Vaccinations are up to date.    Past Medical History: Patient Active Problem List   Diagnosis Date Noted  . Postpartum care following vaginal delivery (5/11) 08/10/2014  . Pregnancy 08/09/2014  . Concentration deficit 12/27/2012  . Anxiety state, unspecified 12/27/2012  . GERD (gastroesophageal reflux disease) 09/05/2012  . Seasonal allergies 09/05/2012  . Shortness of breath 09/05/2012  . Cough 09/05/2012    Medication List:  Allergies as of 07/06/2018      Reactions   Multihance [gadobenate] Nausea And Vomiting   Peanuts [peanut Oil] Itching, Other (See Comments)   Runny nose, Sneezing   Pollen Extract Itching, Other (See Comments)   Runny nose, Sneezing      Medication List       Accurate as of July 06, 2018  1:17 PM. Always use your most recent med list.        albuterol 108 (90 Base) MCG/ACT inhaler Commonly known as:  PROVENTIL HFA;VENTOLIN HFA Inhale 2 puffs into the lungs every 6 (six) hours as needed for wheezing or shortness of breath.   cetirizine 10 MG tablet Commonly known as:  ZYRTEC Take 10 mg by mouth daily.   ibuprofen 200 MG tablet Commonly known as:  ADVIL,MOTRIN Take 3 tablets (600 mg total) by mouth every 6 (six) hours as needed.       Birth History: non-contributory   Developmental History: non-contributory  Past Surgical History: Past Surgical History:  Procedure Laterality Date  . DILATION AND CURETTAGE OF UTERUS    . HERNIA REPAIR    . INTRAUTERINE DEVICE (IUD) INSERTION       Family History: Family History  Problem Relation Age of Onset  . Cancer Father   . Cancer Paternal Grandmother   . Allergic rhinitis Neg Hx   . Asthma Neg Hx   . Eczema Neg Hx   . Urticaria Neg Hx      Social History: [place]errynlives at home with her family. She lives in a house that is 45years old. There is hardwood in the main living areas and carpeting in the bedroom. There is gas and electric heating with central cooling. There are no animals inside or outside of the home. There are  no dust mite coverings on the bedding. There is tobacco exposure, but she herself is not a smoker at all. 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     Review of Systems  Constitutional: Negative.  Negative for fever, malaise/fatigue and weight loss.  HENT: Negative.  Negative for congestion, ear discharge and ear pain.   Eyes: Negative for pain, discharge and redness.  Respiratory: Negative for cough, sputum production, shortness of breath and wheezing.   Cardiovascular: Negative.  Negative for chest pain and palpitations.  Gastrointestinal: Negative for abdominal pain, heartburn, nausea and vomiting.  Skin: Positive for itching and rash.  Neurological: Negative for dizziness and headaches.  Endo/Heme/Allergies: Negative for environmental allergies. Does not bruise/bleed easily.       Objective:   Blood pressure 100/70, pulse (!) 113, temperature 98.1 F (36.7 C), temperature source Oral, height 5' 0.2" (1.529 m), weight 222 lb (100.7 kg), SpO2 97 %, unknown if currently breastfeeding. Body mass index is 43.07 kg/m.   Physical Exam:   Physical Exam  Constitutional: She appears well-developed and well-nourished.   Pleasant female.   HENT:  Head: Normocephalic and atraumatic.  Right Ear: Tympanic membrane, external ear and ear canal normal. No drainage, swelling or tenderness. Tympanic membrane is not injected, not scarred, not erythematous, not retracted and not bulging.  Left Ear: Tympanic membrane, external ear and ear canal normal. No drainage, swelling or tenderness. Tympanic membrane is not injected, not scarred, not erythematous, not retracted and not bulging.  Nose: No mucosal edema, rhinorrhea, nasal deformity or septal deviation. No epistaxis. Right sinus exhibits no maxillary sinus tenderness and no frontal sinus tenderness. Left sinus exhibits no maxillary sinus tenderness and no frontal sinus tenderness.  Mouth/Throat: Uvula is midline and oropharynx is clear and moist. Mucous membranes are not pale and not dry.  No polyps appreciated. Cobblestoning present in the posterior oropharynx.   Eyes: Pupils are equal, round, and reactive to light. Conjunctivae and EOM are normal. Right eye exhibits no chemosis and no discharge. Left eye exhibits no chemosis and no discharge. Right conjunctiva is not injected. Left conjunctiva is not injected.  Cardiovascular: Normal rate, regular rhythm and normal heart sounds.  Respiratory: Effort normal and breath sounds normal. No accessory muscle usage. No tachypnea. No respiratory distress. She has no wheezes. She has no rhonchi. She has no rales. She exhibits no tenderness.  No crackles or wheezing.   GI: There is no abdominal tenderness. There is no rebound and no guarding.  Lymphadenopathy:       Head (right side): No submandibular, no tonsillar and no occipital adenopathy present.       Head (left side): No submandibular, no tonsillar and no occipital adenopathy present.    She has no cervical adenopathy.  Neurological: She is alert.  Skin: Rash noted. No abrasion and no petechiae noted. Rash is urticarial. Rash is not papular and not vesicular. No erythema.  No pallor.  Multiple urticaria present over the arms as well as neck and anterior thorax. There are very few urtiaria on the back. She does spend a majority of the time scratchings incessantly and she clearly looks uncomfortable.   Psychiatric: She has a normal mood and affect.     Diagnostic studies: deferred due to recent antihistamine use        Salvatore Marvel, MD [facility]

## 2018-07-08 ENCOUNTER — Telehealth: Payer: Self-pay

## 2018-07-08 ENCOUNTER — Ambulatory Visit: Payer: Self-pay | Admitting: Allergy and Immunology

## 2018-07-08 NOTE — Telephone Encounter (Signed)
Pt. Calling stating she was seen on Monday by Dr. Dellis Anes. She was given 80 mg of Depo Medrol and a Monday pak of prednisone in which she thinks she  through out so wanted to know if we could give her another prednisone pack. Thermon Leyland FNP reviewed her chart and ok'd for me to give the pt. Another prednisone pak. Pt. Will pick up before lunch.

## 2018-07-09 ENCOUNTER — Encounter: Payer: Self-pay | Admitting: *Deleted

## 2018-07-10 LAB — IGE+ALLERGENS ZONE 2(30)
Alternaria Alternata IgE: <0.1 kU/L
Amer Sycamore IgE Qn: 1.66 kU/L — AB
Aspergillus Fumigatus IgE: <0.1 kU/L
Bahia Grass IgE: 2.13 kU/L — AB
Bermuda Grass IgE: 4.2 kU/L — AB
Cat Dander IgE: 1.12 kU/L — AB
Cedar, Mountain IgE: 0.33 kU/L — AB
Cladosporium Herbarum IgE: <0.1 kU/L
Cockroach, American IgE: <0.1 kU/L
Common Silver Birch IgE: 4.34 kU/L — AB
D Farinae IgE: 2.73 kU/L — AB
D Pteronyssinus IgE: 0.13 kU/L — AB
Dog Dander IgE: 0.16 kU/L — AB
Elm, American IgE: 4.08 kU/L — AB
Hickory, White IgE: 1.88 kU/L — AB
IgE (Immunoglobulin E), Serum: 193 IU/mL (ref 6–495)
Johnson Grass IgE: 1.47 kU/L — AB
Maple/Box Elder IgE: 4.4 kU/L — AB
Mucor Racemosus IgE: <0.1 kU/L
Mugwort IgE Qn: 1.7 kU/L — AB
Nettle IgE: 0.18 kU/L — AB
Oak, White IgE: 1.35 kU/L — AB
Penicillium Chrysogen IgE: <0.1 kU/L
Pigweed, Rough IgE: 2.27 kU/L — AB
Plantain, English IgE: 3.59 kU/L — AB
Ragweed, Short IgE: 2.81 kU/L — AB
Sheep Sorrel IgE Qn: 4.44 kU/L — AB
Stemphylium Herbarum IgE: <0.1 kU/L
Sweet gum IgE RAST Ql: 2.24 kU/L — AB
Timothy Grass IgE: 3.03 kU/L — AB
White Mulberry IgE: <0.1 kU/L

## 2018-07-10 LAB — ALLERGEN PROFILE, BASIC FOOD
Allergen Corn, IgE: 0.26 kU/L — AB
Beef IgE: <0.1 kU/L
Chocolate/Cacao IgE: <0.1 kU/L
Egg, Whole IgE: <0.1 kU/L
Food Mix (Seafoods) IgE: NEGATIVE
Milk IgE: <0.1 kU/L
Peanut IgE: 0.4 kU/L — AB
Pork IgE: <0.1 kU/L
Soybean IgE: 0.12 kU/L — AB
Wheat IgE: 0.55 kU/L — AB

## 2018-07-10 LAB — CMP14+EGFR
ALT: 43 IU/L — ABNORMAL HIGH (ref 0–32)
AST: 20 IU/L (ref 0–40)
Albumin/Globulin Ratio: 2 (ref 1.2–2.2)
Albumin: 4.1 g/dL (ref 3.8–4.8)
Alkaline Phosphatase: 67 IU/L (ref 39–117)
BUN/Creatinine Ratio: 12 (ref 9–23)
BUN: 10 mg/dL (ref 6–24)
Bilirubin Total: 0.7 mg/dL (ref 0.0–1.2)
CO2: 21 mmol/L (ref 20–29)
Calcium: 9 mg/dL (ref 8.7–10.2)
Chloride: 101 mmol/L (ref 96–106)
Creatinine, Ser: 0.86 mg/dL (ref 0.57–1.00)
GFR calc Af Amer: 95 mL/min/{1.73_m2} (ref >59)
GFR calc non Af Amer: 82 mL/min/{1.73_m2} (ref >59)
Globulin, Total: 2.1 g/dL (ref 1.5–4.5)
Glucose: 173 mg/dL — ABNORMAL HIGH (ref 65–99)
Potassium: 4.2 mmol/L (ref 3.5–5.2)
Sodium: 138 mmol/L (ref 134–144)
Total Protein: 6.2 g/dL (ref 6.0–8.5)

## 2018-07-10 LAB — SEDIMENTATION RATE: Sed Rate: 5 mm/hr (ref 0–32)

## 2018-07-10 LAB — TRYPTASE: Tryptase: 7.1 ug/L (ref 2.2–13.2)

## 2018-07-10 LAB — ANA W/REFLEX IF POSITIVE: Anti Nuclear Antibody (ANA): NEGATIVE

## 2018-07-10 LAB — C-REACTIVE PROTEIN: CRP: 23 mg/L — ABNORMAL HIGH (ref 0–10)

## 2018-07-10 LAB — CHRONIC URTICARIA: cu index: 6.7 (ref <10)

## 2018-08-20 ENCOUNTER — Telehealth: Payer: 59 | Admitting: Allergy & Immunology

## 2018-08-20 ENCOUNTER — Encounter: Payer: Self-pay | Admitting: Family Medicine

## 2018-08-20 ENCOUNTER — Ambulatory Visit: Payer: 59 | Admitting: Allergy & Immunology

## 2018-08-20 ENCOUNTER — Other Ambulatory Visit: Payer: Self-pay

## 2018-08-20 ENCOUNTER — Telehealth (INDEPENDENT_AMBULATORY_CARE_PROVIDER_SITE_OTHER): Payer: 59 | Admitting: Family Medicine

## 2018-08-20 DIAGNOSIS — L508 Other urticaria: Secondary | ICD-10-CM

## 2018-08-20 DIAGNOSIS — J310 Chronic rhinitis: Secondary | ICD-10-CM | POA: Diagnosis not present

## 2018-08-20 DIAGNOSIS — J4520 Mild intermittent asthma, uncomplicated: Secondary | ICD-10-CM | POA: Insufficient documentation

## 2018-08-20 MED ORDER — MONTELUKAST SODIUM 10 MG PO TABS: 10.0000 mg | ORAL_TABLET | Freq: Every day | ORAL | 5 refills | Status: DC

## 2018-08-20 MED ORDER — ALBUTEROL SULFATE HFA 108 (90 BASE) MCG/ACT IN AERS: 2.0000 | INHALATION_SPRAY | Freq: Four times a day (QID) | RESPIRATORY_TRACT | 2 refills | Status: AC | PRN

## 2018-08-20 MED ORDER — FAMOTIDINE 20 MG PO TABS: 20.0000 mg | ORAL_TABLET | Freq: Two times a day (BID) | ORAL | 3 refills | Status: AC

## 2018-08-20 NOTE — Progress Notes (Signed)
RE: [name] MRN: [id number] DOB: [birth year] Date of Telemedicine Visit: 08/20/2018  Referring provider: Gillian ScarceZanard, Robyn K, MD Primary care provider: Gillian ScarceZanard, Robyn K, MD  Chief Complaint: Urticaria   Telemedicine Follow Up Visit via Telephone: I connected with Hettie HolsteinSharna Constante for a follow up on 08/20/18 by telephone and verified that I am speaking with the correct person using two identifiers.   I discussed the limitations, risks, security and privacy concerns of performing an evaluation and management service by telephone and the availability of in person appointments. I also discussed with the patient that there may be a patient responsible charge related to this service. The patient expressed understanding and agreed to proceed.  Patient is at home  Provider is at the office.  Visit start time: 10:30 Visit end time: 10:59 Insurance consent/check in by: Rosalita LevanMadison Buchanan Medical consent and medical assistant/nurse: Maryjean MornLogan Freeman  History of Present Illness: She is a 43 y.o. female, who is being followed for asthma, chronic urticaria, and allergic rhinitis. Her previous allergy office visit was on 07/06/2018 with Dr. Dellis AnesGallagher. She reports that she continues to experience hives that occur in different areas at least once a day. She reports the hives are occasionally itchy and occur mostly on her arms, trunk, and face. She denies any clear cut source of the hives. She reports that her hives did not resolve after the DepoMedrol injection in this office followed by a prednisone taper, however, they did slow down and she had fewer hives during that time. She is taking hydroxyzine half of a 25 mg 3-4 times a day. She has tried Careers adviserAllegra and stopped this medication due to feeling like she had a fever and tachycardia. She is not currently taking montelukast or famotidine at this time. She is interested in having more information about Xolair. She occasionally takes cetirizine for allergic  rhinitis. Asthma is reported as well controlled with no medication at this time. She last used albuterol about 1 year ago. Her current medications are listed in the chart.    Assessment and Plan: Patient Instructions  Chronic urticaria Continue hydroxyzine up to three times a day for itching Begin famotidine 20 mg twice a day to help reduce itch Begin montelukast 10 mg once a day to help reduce itch If your symptoms recur, begin a journal of events that occurred for up to 6 hours before your symptoms began including foods and beverages consumed, soaps or perfumes you had contact with, and medications.  Call the clinic if you are interested in beginning Xolair injections for better control of hives  Mild intermittent asthma Continue albuterol 2 puffs every 4 hours as needed for cough or wheeze  Chronic rhinitis Cetirizine 10 mg once a day as needed for a runny nose or itch Flonase nasal spray 1-2 sprays in each nostril once a day as needed for a stuffy nose  Call the clinic if this treatment plan is not working well for you  Follow up in 6 weeks or sooner if needed    Return in about 6 weeks (around 10/01/2018), or if symptoms worsen or fail to improve.  Meds ordered this encounter  Medications   albuterol (VENTOLIN HFA) 108 (90 Base) MCG/ACT inhaler    Sig: Inhale 2 puffs into the lungs every 6 (six) hours as needed for wheezing or shortness of breath.    Dispense:  1 Inhaler    Refill:  2   famotidine (PEPCID) 20 MG tablet    Sig: Take 1  tablet (20 mg total) by mouth 2 (two) times daily.    Dispense:  60 tablet    Refill:  3   montelukast (SINGULAIR) 10 MG tablet    Sig: Take 1 tablet (10 mg total) by mouth at bedtime.    Dispense:  30 tablet    Refill:  5    Medication List:  Current Outpatient Medications  Medication Sig Dispense Refill   albuterol (VENTOLIN HFA) 108 (90 Base) MCG/ACT inhaler Inhale 2 puffs into the lungs every 6 (six) hours as needed for wheezing or  shortness of breath. 1 Inhaler 2   cetirizine (ZYRTEC) 10 MG tablet Take 10 mg by mouth daily.     ibuprofen (ADVIL,MOTRIN) 200 MG tablet Take 3 tablets (600 mg total) by mouth every 6 (six) hours as needed. 120 tablet 0   Clobetasol Prop Emollient Base 0.05 % emollient cream      famotidine (PEPCID) 20 MG tablet Take 1 tablet (20 mg total) by mouth 2 (two) times daily. 60 tablet 3   hydrOXYzine (ATARAX/VISTARIL) 25 MG tablet TAKE 1/2   1 TAB BY MOUTH 3 TIMES A DAY AS NEEDED FOR ITCHING/HIVES     montelukast (SINGULAIR) 10 MG tablet Take 1 tablet (10 mg total) by mouth at bedtime. 30 tablet 5   No current facility-administered medications for this visit.    Allergies: Allergies  Allergen Reactions   Multihance [Gadobenate] Nausea And Vomiting    MRI contrast   Peanuts [Peanut Oil] Itching and Other (See Comments)    Runny nose, Sneezing   Pollen Extract Itching and Other (See Comments)    Runny nose, Sneezing   I reviewed her past medical history, social history, family history, and environmental history and no significant changes have been reported from previous visit on 07/06/2018.  Objective: Physical Exam Not obtained as encounter was done via telephone.   Previous notes and tests were reviewed.  I discussed the assessment and treatment plan with the patient. The patient was provided an opportunity to ask questions and all were answered. The patient agreed with the plan and demonstrated an understanding of the instructions.   The patient was advised to call back or seek an in-person evaluation if the symptoms worsen or if the condition fails to improve as anticipated.  I provided 29 minutes of non-face-to-face time during this encounter.  It was my pleasure to participate in [place] Gavina's care today. Please feel free to contact me with any questions or concerns.   Sincerely,  Thermon Leyland, FNP

## 2018-08-20 NOTE — Patient Instructions (Addendum)
Chronic urticaria Continue hydroxyzine up to three times a day for itching Begin famotidine 20 mg twice a day to help reduce itch Begin montelukast 10 mg once a day to help reduce itch If your symptoms recur, begin a journal of events that occurred for up to 6 hours before your symptoms began including foods and beverages consumed, soaps or perfumes you had contact with, and medications.  Call the clinic if you are interested in beginning Xolair injections for better control of hives  Mild intermittent asthma Continue albuterol 2 puffs every 4 hours as needed for cough or wheeze  Chronic rhinitis Cetirizine 10 mg once a day as needed for a runny nose or itch Flonase nasal spray 1-2 sprays in each nostril once a day as needed for a stuffy nose  Call the clinic if this treatment plan is not working well for you  Follow up in 6 weeks or sooner if needed

## 2018-09-13 ENCOUNTER — Encounter (HOSPITAL_COMMUNITY): Payer: Self-pay

## 2018-09-13 ENCOUNTER — Other Ambulatory Visit: Payer: Self-pay

## 2018-09-13 ENCOUNTER — Emergency Department (HOSPITAL_COMMUNITY): Payer: 59

## 2018-09-13 ENCOUNTER — Emergency Department (HOSPITAL_COMMUNITY)
Admission: EM | Admit: 2018-09-13 | Discharge: 2018-09-14 | Disposition: A | Discharge status: Home / Self Care | Payer: 59 | Attending: Emergency Medicine | Admitting: Emergency Medicine

## 2018-09-13 DIAGNOSIS — Z91010 Allergy to peanuts: Secondary | ICD-10-CM | POA: Diagnosis not present

## 2018-09-13 DIAGNOSIS — Z79899 Other long term (current) drug therapy: Secondary | ICD-10-CM | POA: Insufficient documentation

## 2018-09-13 DIAGNOSIS — M25562 Pain in left knee: Secondary | ICD-10-CM | POA: Diagnosis not present

## 2018-09-13 DIAGNOSIS — J45909 Unspecified asthma, uncomplicated: Secondary | ICD-10-CM | POA: Diagnosis not present

## 2018-09-13 DIAGNOSIS — Y9369 Activity, other involving other sports and athletics played as a team or group: Secondary | ICD-10-CM | POA: Diagnosis not present

## 2018-09-13 MED ORDER — HYDROCODONE-ACETAMINOPHEN 5-325 MG PO TABS: 1.0000 | ORAL_TABLET | Freq: Four times a day (QID) | ORAL | 0 refills | Status: AC | PRN

## 2018-09-13 NOTE — Discharge Instructions (Addendum)
Follow up with orthopedics within the next week.  Do not drive or operate heavy machinery while taking this new medications.  I would suggest ice, elevation and rest of your leg.  Return to the ED for any new or worsening symptoms.

## 2018-09-13 NOTE — ED Provider Notes (Signed)
[facility] DEPT Provider Note   CSN: [id number] Arrival date & time: 09/13/18  2156  History   Chief Complaint Knee pain  HPI [name] is a 43 y.o. female with past history significant for chronic urticaria who presents for evaluation of knee pain.  Patient states she was playing kickball when she kicked with her left leg and felt a pop and a click to her left lateral knee.  Patient states she possibly could have hyperextended her knee however she is unsure this.  Patient states she was originally able to ambulate however has had increasing pain.  She has not taken anything for pain.  She rates her pain a 9/10.  Denies radiation of pain.  She denies falling.  Denies fever, chills, nausea, vomiting, abdominal pain, diarrhea, dysuria, decreased range of motion, numbness or tingling her extremities, redness, swelling, warmth of her extremities.  Denies additional aggravating or alleviating factors.  Symptoms increasing since onset.   History obtained from patient and past medical records.  No interpreter was used.     HPI  Past Medical History:  Diagnosis Date   Asthma    Headache(784.0)    Postpartum care following vaginal delivery (5/11) 08/10/2014   Urticaria     Patient Active Problem List   Diagnosis Date Noted   Chronic urticaria 08/20/2018   Chronic rhinitis 08/20/2018   Mild intermittent asthma, uncomplicated 51/76/1607   Postpartum care following vaginal delivery (5/11) 08/10/2014   Pregnancy 08/09/2014   Concentration deficit 12/27/2012   Anxiety state, unspecified 12/27/2012   GERD (gastroesophageal reflux disease) 09/05/2012   Seasonal allergies 09/05/2012   Shortness of breath 09/05/2012   Cough 09/05/2012    Past Surgical History:  Procedure Laterality Date   DILATION AND CURETTAGE OF UTERUS     HERNIA REPAIR     INTRAUTERINE DEVICE (IUD) INSERTION       OB History    Gravida  7   Para  4   Term   3   Preterm      AB  3   Living  4     SAB  1   TAB  2   Ectopic      Multiple  0   Live Births  4            Home Medications    Prior to Admission medications   Medication Sig Start Date End Date Taking? Authorizing Provider  albuterol (VENTOLIN HFA) 108 (90 Base) MCG/ACT inhaler Inhale 2 puffs into the lungs every 6 (six) hours as needed for wheezing or shortness of breath. 08/20/18   Ambs, Kathrine Cords, FNP  cetirizine (ZYRTEC) 10 MG tablet Take 10 mg by mouth daily.    [provider]  Clobetasol Prop Emollient Base 0.05 % emollient cream  07/07/18   [provider]  famotidine (PEPCID) 20 MG tablet Take 1 tablet (20 mg total) by mouth 2 (two) times daily. 08/20/18   Dara Hoyer, FNP  HYDROcodone-acetaminophen (NORCO/VICODIN) 5-325 MG tablet Take 1 tablet by mouth every 6 (six) hours as needed. 09/13/18   Khyron Garno A, PA-C  hydrOXYzine (ATARAX/VISTARIL) 25 MG tablet TAKE 1/2   1 TAB BY MOUTH 3 TIMES A DAY AS NEEDED FOR ITCHING/HIVES 07/30/18   [provider]  ibuprofen (ADVIL,MOTRIN) 200 MG tablet Take 3 tablets (600 mg total) by mouth every 6 (six) hours as needed. 08/12/14   Julianne Handler, CNM  montelukast (SINGULAIR) 10 MG tablet Take 1  tablet (10 mg total) by mouth at bedtime. 08/20/18   Ambs, Norvel RichardsAnne M, FNP    Family History Family History  Problem Relation Age of Onset   Cancer Father    Cancer Paternal Grandmother    Allergic rhinitis Neg Hx    Asthma Neg Hx    Eczema Neg Hx    Urticaria Neg Hx     Social History Social History   Tobacco Use   Smoking status: Never Smoker   Smokeless tobacco: Never Used  Substance Use Topics   Alcohol use: Yes    Comment: Occasional   Drug use: No     Allergies   Multihance [gadobenate], Peanuts [peanut oil], and Pollen extract   Review of Systems Review of Systems  Constitutional: Negative.   HENT: Negative.   Respiratory: Negative.   Cardiovascular: Negative.     Gastrointestinal: Negative.   Genitourinary: Negative.   Musculoskeletal:       Left knee pain  Skin: Negative.   Neurological: Negative.   All other systems reviewed and are negative.    Physical Exam Updated Vital Signs BP 118/87    Pulse 98    Temp 99.4 F (37.4 C) (Oral)    Resp 18    SpO2 100%   Physical Exam Vitals signs and nursing note reviewed.  Constitutional:      General: She is not in acute distress.    Appearance: She is well-developed.  HENT:     Head: Atraumatic.     Nose: Nose normal.     Mouth/Throat:     Mouth: Mucous membranes are moist.     Pharynx: Oropharynx is clear.  Eyes:     Pupils: Pupils are equal, round, and reactive to light.  Neck:     Musculoskeletal: Normal range of motion.  Cardiovascular:     Rate and Rhythm: Normal rate.     Pulses: Normal pulses.     Heart sounds: Normal heart sounds.     Comments: 2+ DP, PT pulses bilaterally. Pulmonary:     Effort: Pulmonary effort is normal. No respiratory distress.     Breath sounds: Normal breath sounds. No stridor. No wheezing, rhonchi or rales.  Abdominal:     General: Bowel sounds are normal. There is no distension.  Musculoskeletal: Normal range of motion.     Comments: Full range of motion right lower extremity that difficulty.  Patient with difficulty with active flexion to left knee.  Full passive range of motion to left knee.  Tenderness to lateral joint line.  Negative anterior drawer bilaterally.  Positive valgus stress to left knee.  Negative varus stress. Lower extremity compartments are soft.  She has no tenderness to tibia/fibula, femur.  Able to straight leg raise without difficulty.  No joint effusion.  No obvious deformity or injury.  No tenderness to popliteal fossa.  Skin:    General: Skin is warm and dry.     Comments: No edema, erythema, ecchymosis or warmth.  No rashes or lesions.  Brisk capillary refill.  Neurological:     Mental Status: She is alert.     Comments:  Decreased strength with flexion in the left knee.  Ambulatory with a limp to left knee.    ED Treatments / Results  Labs (all labs ordered are listed, but only abnormal results are displayed) Labs Reviewed - No data to display  EKG None  Radiology Dg Knee Complete 4 Views Left  Result Date: 09/13/2018 CLINICAL DATA:  Injury.  Left knee pain. EXAM: LEFT KNEE - COMPLETE 4+ VIEW COMPARISON:  None. FINDINGS: No evidence of fracture, dislocation, or joint effusion. No evidence of arthropathy or other focal bone abnormality. Soft tissues are unremarkable. IMPRESSION: Negative. Electronically Signed   By: Amie Portlandavid  Ormond M.D.   On: 09/13/2018 23:03    Procedures Procedures (including critical care time)  Medications Ordered in ED Medications - No data to display   Initial Impression / Assessment and Plan / ED Course  I have reviewed the triage vital signs and the nursing notes.  Pertinent labs & imaging results that were available during my care of the patient were reviewed by me and considered in my medical decision making (see chart for details).  [age] female appears otherwise well presents for evaluation of left knee pain.  Afebrile, nonseptic, non-ill-appearing.  Patient with left knee injury while playing kickball.  Decreased active range of motion with flexion to the left knee.  She has full passive range of motion.  She has negative anterior drawer bilaterally.  Positive valgus stress to the left knee.  Tenderness to left lateral joint line.  No obvious deformity or injury.  Lower extremity compartments are soft.  No bony tenderness to femur or tibia/fibula.  Neurovascularly intact.  He is able to ambulate however with pain to the left knee.  She is able to straight leg raise without difficulty.  Concern for possible tendon or ligament injury given tenderness to lateral joint line and valgus stress.  Pain film left knee negative for fracture, dislocation or effusion.  She has no overlying  skin changes to her knee.  Placed patient in knee immobilizer and provided crutches.  Discussed rice for symptomatic management.  Patient to follow-up with orthopedics if she continues to have symptoms.  Low uspicion for septic joint, gout, hemarthrosis, fracture, dislocation, vascular injury, nerve injury.  The patient has been appropriately medically screened and/or stabilized in the ED. I have low suspicion for any other emergent medical condition which would require further screening, evaluation or treatment in the ED or require inpatient management.  Patient is hemodynamically stable and in no acute distress.  Patient able to ambulate in department prior to ED.  Evaluation does not show acute pathology that would require ongoing or additional emergent interventions while in the emergency department or further inpatient treatment.  I have discussed the diagnosis with the patient and answered all questions.  Patient has no further complaints prior to discharge.  Patient is comfortable with plan discussed in room and is stable for discharge at this time.  I have discussed strict return precautions for returning to the emergency department.  Patient was encouraged to follow-up with PCP/specialist refer to at discharge.     Final Clinical Impressions(s) / ED Diagnoses   Final diagnoses:  Acute pain of left knee    ED Discharge Orders         Ordered    HYDROcodone-acetaminophen (NORCO/VICODIN) 5-325 MG tablet  Every 6 hours PRN     09/13/18 2358           Amori Cooperman A, PA-C 09/14/18 0024    Long, Arlyss RepressJoshua G, MD 09/15/18 [phone]

## 2018-09-13 NOTE — ED Notes (Signed)
Bed: WTR6 Expected date:  Expected time:  Means of arrival:  Comments: 

## 2018-09-13 NOTE — ED Triage Notes (Signed)
Pt complains of left knee pain after playing kickball today and hyperextending her knee

## 2018-10-06 ENCOUNTER — Encounter: Payer: Self-pay | Admitting: Physical Therapy

## 2018-10-06 ENCOUNTER — Other Ambulatory Visit: Payer: Self-pay

## 2018-10-06 ENCOUNTER — Ambulatory Visit: Payer: 59 | Attending: Orthopaedic Surgery | Admitting: Physical Therapy

## 2018-10-06 DIAGNOSIS — M25562 Pain in left knee: Secondary | ICD-10-CM | POA: Diagnosis present

## 2018-10-06 DIAGNOSIS — R29898 Other symptoms and signs involving the musculoskeletal system: Secondary | ICD-10-CM | POA: Diagnosis present

## 2018-10-06 NOTE — Therapy (Signed)
[facility] [address]  [address] [place], [place], [address] Phone: [phone]   Fax:  [phone]  Physical Therapy Evaluation  Patient Details  Name: [name] MRN: [id number] Date of Birth: 09-01-1975 Referring Provider (PT): Ramond Marrowax Varkey, MD   Encounter Date: 10/06/2018  PT End of Session - 10/06/18 1359    Visit Number  1    Number of Visits  5    Date for PT Re-Evaluation  11/03/18    Authorization Type  [employer]    PT Start Time  1313    PT Stop Time  1352    PT Time Calculation (min)  39 min    Activity Tolerance  Patient tolerated treatment well    Behavior During Therapy  [facility] for tasks assessed/performed       Past Medical History:  Diagnosis Date  . Asthma   . Headache(784.0)   . Postpartum care following vaginal delivery (5/11) 08/10/2014  . Urticaria     Past Surgical History:  Procedure Laterality Date  . DILATION AND CURETTAGE OF UTERUS    . HERNIA REPAIR    . INTRAUTERINE DEVICE (IUD) INSERTION      There were no vitals filed for this visit.   Subjective Assessment - 10/06/18 1314    Subjective  Patient reports L knee pain started 3 weeks ago. Was playing kickball and ran into a hole. Went to ED d/t pain and was given crutches and a knee brace. Pain has improved since initial onset, and is now off crutches. Wearing brace intermittently.  Pain located over L infrapatellar, lateral, and medial knee. Also having swelling. Worse with prolonged standing/walking, prolonged sitting, having to take her time with stairs. Better with brace. Intermittent tingling around the knee.    Pertinent History  chronic urticaria, HA, asthma, hernia repair    Limitations  Sitting;Lifting;Standing;Walking;House hold activities    How long can you sit comfortably?  4 hours    How long can you stand comfortably?  1 hour    How long can you walk comfortably?  unknown    Diagnostic tests  09/13/18 L knee xray:  negative    Patient Stated Goals  build strength, ROM and return to running and working out    Currently in Pain?  No/denies    Pain Location  Neck    Pain Orientation  Left;Lateral;Lower    Pain Descriptors / Indicators  Dull    Pain Type  Acute pain         [facility] PT Assessment - 10/06/18 1325      Assessment   Medical Diagnosis  L Knee Sprain     Referring Provider (PT)  Ramond Marrowax Varkey, MD    Onset Date/Surgical Date  09/15/18    Next MD Visit  11/03/18    Prior Therapy  No      Precautions   Precautions  --   asthma     Restrictions   Weight Bearing Restrictions  No      Balance Screen   Has the patient fallen in the past 6 months  No    Has the patient had a decrease in activity level because of a fear of falling?   No    Is the patient reluctant to leave their home because of a fear of falling?   No      Home Environment   Living Environment  Private residence    Type of Home  House    Home Access  Stairs to enter    Entrance Stairs-Number of Steps  3    Entrance Stairs-Rails  None    Home Layout  One level    Home Equipment  Crutches      Prior Function   Level of Independence  Independent    Vocation  Full time employment    Vocation Requirements  sitting, computer work    Leisure  working out, Physicist, medical Status  Within [employer] for tasks assessed      Observation/Other Assessments   Focus on Therapeutic Outcomes (FOTO)   Knee: 69 (31% limited, 18% predicted)      Sensation   Light Touch  Appears Intact      Coordination   Gross Motor Movements are Fluid and Coordinated  Yes      Posture/Postural Control   Posture/Postural Control  Postural limitations    Postural Limitations  Rounded Shoulders;Weight shift right      ROM / Strength   AROM / PROM / Strength  AROM;PROM;Strength      AROM   AROM Assessment Site  Knee    Right/Left Knee  Left;Right    Right Knee Extension  0    Right Knee Flexion  125     Left Knee Extension  -1    Left Knee Flexion  125      PROM   PROM Assessment Site  Knee    Right/Left Knee  Left;Right    Right Knee Extension  0    Right Knee Flexion  126    Left Knee Extension  -2    Left Knee Flexion  131      Strength   Strength Assessment Site  Hip;Knee;Ankle    Right/Left Hip  Right;Left    Right Hip Flexion  4+/5    Right Hip ABduction  4+/5    Right Hip ADduction  4+/5    Left Hip Flexion  4+/5    Left Hip ABduction  4+/5    Left Hip ADduction  4+/5   medial knee discomfort   Right/Left Knee  Right;Left    Right Knee Flexion  4/5    Right Knee Extension  4+/5    Left Knee Flexion  4/5    Left Knee Extension  4+/5    Right/Left Ankle  Right;Left    Right Ankle Dorsiflexion  4+/5    Right Ankle Plantar Flexion  4+/5    Left Ankle Dorsiflexion  4+/5    Left Ankle Plantar Flexion  4+/5      Flexibility   Soft Tissue Assessment /Muscle Length  yes   B hip flexors mildly tight   Hamstrings  mildly tight in B LEs      Palpation   Patella mobility  normal mobility and no TTP B    Palpation comment  TTP in L medial knee near MCL      Ambulation/Gait   Assistive device  None    Gait Pattern  Step-through pattern;Decreased stance time - left;Decreased step length - right   B hip instability; very mildly antalgic on L   Ambulation Surface  Level;Indoor    Gait velocity  WFL                Objective measurements completed on examination: See above findings.              PT Education - 10/06/18  1358    Education Details  prognosis, POC, HEP; edu on 5 min of L medial knee ice massage for pain and edema relief    Person(s) Educated  Patient    Methods  Explanation;Demonstration;Tactile cues;Verbal cues;Handout    Comprehension  Verbalized understanding;Returned demonstration       PT Short Term Goals - 10/06/18 1406      PT SHORT TERM GOAL #1   Title  Patient to be independent with initial HEP.    Time  2    Period  Weeks     Status  New    Target Date  10/20/18        PT Long Term Goals - 10/06/18 1407      PT LONG TERM GOAL #1   Title  Patient to be independent with advanced HEP.    Time  4    Period  Weeks    Status  New    Target Date  11/03/18      PT LONG TERM GOAL #2   Title  Patient to demonstrate no tightness in B hip flexors and hamstrings.    Time  4    Period  Weeks    Status  New    Target Date  11/03/18      PT LONG TERM GOAL #3   Title  Patient to demonstrate good knee stability and reciprocal pattern without handrail up/down 13 steps without pain limiting.    Time  4    Period  Weeks    Status  New    Target Date  11/03/18      PT LONG TERM GOAL #4   Title  Patient to report tolerance of 1 week of HIIT workouts without pain limiting.    Time  4    Period  Weeks    Status  New    Target Date  11/03/18      PT LONG TERM GOAL #5   Title  Patient to report 80% improvement in L knee pain.    Time  4    Period  Weeks    Status  New    Target Date  11/03/18             Plan - 10/06/18 1359    Clinical Impression Statement  Patient is a 43y/o F presenting to OPPT with c/o acute L knee pain of 3 weeks duration. Was given crutches and knee brace in ED at initial onset, and has now weaned off of these- wearing brace intermittently for comfort. Pain is located along L medial, lateral, and infrapatellar region. Worse with prolonged standing/walking/sitting and has to take her time with stairs. Patient would like to return to running and HIIT workouts. Patient today with good overall LE strength, good L knee ROM, mild tightness in B HS and hip flexors, gait deviations, and TTP at medial knee. Educated patient on gentle stretching and strengthening HEP- patient reported understanding. Would benefit from skilled PT services 1x/week for 4 weeks to address aforementioned impairments.    Personal Factors and Comorbidities  Age;Sex;Comorbidity 3+;Time since onset of  injury/illness/exacerbation;Fitness;Past/Current Experience    Comorbidities  chronic urticaria, HA, asthma, hernia repair    Examination-Activity Limitations  Sit;Bend;Squat;Stairs;Carry;Stand;Lift;Locomotion Level    Examination-Participation Restrictions  Church;Cleaning;Shop;Driving;Yard Work;Interpersonal Relationship;Laundry    Stability/Clinical Decision Making  Stable/Uncomplicated    Clinical Decision Making  Low    Rehab Potential  Good    PT Frequency  1x / week  PT Duration  4 weeks    PT Treatment/Interventions  ADLs/Self Care Home Management;Cryotherapy;Electrical Stimulation;Iontophoresis 4mg /ml Dexamethasone;Moist Heat;Ultrasound;Gait training;Stair training;Functional mobility training;Therapeutic activities;Therapeutic exercise;Balance training;Manual techniques;Orthotic Fit/Training;Patient/family education;Neuromuscular re-education;Passive range of motion;Dry needling;Energy conservation;Splinting;Taping;Vasopneumatic Device    PT Next Visit Plan  reassess HEP    Consulted and Agree with Plan of Care  Patient       Patient will benefit from skilled therapeutic intervention in order to improve the following deficits and impairments:  Increased edema, Decreased activity tolerance, Decreased strength, Pain, Difficulty walking, Decreased balance, Improper body mechanics, Postural dysfunction, Impaired flexibility  Visit Diagnosis: 1. Acute pain of left knee   2. Other symptoms and signs involving the musculoskeletal system        Problem List Patient Active Problem List   Diagnosis Date Noted  . Chronic urticaria 08/20/2018  . Chronic rhinitis 08/20/2018  . Mild intermittent asthma, uncomplicated 08/20/2018  . Postpartum care following vaginal delivery (5/11) 08/10/2014  . Pregnancy 08/09/2014  . Concentration deficit 12/27/2012  . Anxiety state, unspecified 12/27/2012  . GERD (gastroesophageal reflux disease) 09/05/2012  . Seasonal allergies 09/05/2012  .  Shortness of breath 09/05/2012  . Cough 09/05/2012    Anette GuarneriYevgeniya Swayze Kozuch, PT, DPT 10/06/18 2:11 PM    [facility] [address]  [address] [place], [place], [address] Phone: [phone]   Fax:  [phone]  Name: [name] MRN: [id number] Date of Birth: January 06, 1976

## 2018-10-14 ENCOUNTER — Encounter: Payer: Self-pay | Admitting: Physical Therapy

## 2018-10-14 ENCOUNTER — Ambulatory Visit: Payer: 59 | Admitting: Physical Therapy

## 2018-10-14 ENCOUNTER — Other Ambulatory Visit: Payer: Self-pay

## 2018-10-14 DIAGNOSIS — M25562 Pain in left knee: Secondary | ICD-10-CM

## 2018-10-14 DIAGNOSIS — R29898 Other symptoms and signs involving the musculoskeletal system: Secondary | ICD-10-CM

## 2018-10-14 NOTE — Therapy (Signed)
[facility] [address]  [address] [place], [place], [address] Phone: [phone]   Fax:  [phone]  Physical Therapy Treatment  Patient Details  Name: Joyce SportSharna Dixon Elliott MRN: [id number] Date of Birth: [birth year] Referring Provider (PT): Ramond Marrowax Varkey, MD   Encounter Date: 10/14/2018  PT End of Session - 10/14/18 1406    Visit Number  2    Number of Visits  5    Date for PT Re-Evaluation  11/03/18    Authorization Type  UHC    PT Start Time  1322   pt late   PT Stop Time  1400    PT Time Calculation (min)  38 min    Activity Tolerance  Patient tolerated treatment well    Behavior During Therapy  [facility] for tasks assessed/performed       Past Medical History:  Diagnosis Date  . Asthma   . Headache(784.0)   . Postpartum care following vaginal delivery (5/11) 08/10/2014  . Urticaria     Past Surgical History:  Procedure Laterality Date  . DILATION AND CURETTAGE OF UTERUS    . HERNIA REPAIR    . INTRAUTERINE DEVICE (IUD) INSERTION      There were no vitals filed for this visit.  Subjective Assessment - 10/14/18 1323    Subjective  Reports that she has returned back to working out- has discomfort and feeling of the knee giving out with some exercises. Still dealing with some swelling.    Pertinent History  chronic urticaria, HA, asthma, hernia repair    Diagnostic tests  09/13/18 L knee xray: negative    Patient Stated Goals  build strength, ROM and return to running and working out    Currently in Pain?  No/denies                       [facility]/Exercise - 10/14/18 0001      Exercises   Exercises  Knee/Hip      Knee/Hip Exercises: Stretches   Hip Flexor Stretch  Left;2 reps;30 seconds    Hip Flexor Stretch Limitations  mod thomas stretch with strap      Knee/Hip Exercises: Aerobic   Recumbent Bike  L2 x 6min       Knee/Hip Exercises: Machines for Strengthening   Cybex Knee  Flexion  B LEs 30# 2x10   good control     Knee/Hip Exercises: Standing   Forward Step Up  Left;1 set;Hand Hold: 2;Step Height: 6";15 reps;Step Height: 8"    Forward Step Up Limitations  10x 6", 5x 8" 2 ski poles; good control but with heavy UE support   L knee discomfort     Knee/Hip Exercises: Supine   Bridges  Strengthening;Both;1 set;10 reps    Bridges Limitations  good form    Bridges with Clamshell  Strengthening;Both;1 set;10 reps   red TB around knees   Straight Leg Raises  Strengthening;Left;1 set;10 reps    Straight Leg Raises Limitations  cues for TKE when lowering    Straight Leg Raise with External Rotation  Strengthening;Left;1 set;10 reps    Straight Leg Raise with External Rotation Limitations  cues for TKE when lowering      Manual Therapy   Manual Therapy  Taping    Kinesiotex  Create Space      Kinesiotix   Create Space  L knee chondramalacia patellae pattern   using sensitive skin tape  PT Education - 10/14/18 1405    Education Details  edu on KT tape wear time, precautions, removal    Person(s) Educated  Patient    Methods  Explanation;Demonstration;Tactile cues;Verbal cues    Comprehension  Verbalized understanding       PT Short Term Goals - 10/14/18 1408      PT SHORT TERM GOAL #1   Title  Patient to be independent with initial HEP.    Time  2    Period  Weeks    Status  On-going    Target Date  10/20/18        PT Long Term Goals - 10/14/18 1408      PT LONG TERM GOAL #1   Title  Patient to be independent with advanced HEP.    Time  4    Period  Weeks    Status  On-going      PT LONG TERM GOAL #2   Title  Patient to demonstrate no tightness in B hip flexors and hamstrings.    Time  4    Period  Weeks    Status  On-going      PT LONG TERM GOAL #3   Title  Patient to demonstrate good knee stability and reciprocal pattern without handrail up/down 13 steps without pain limiting.    Time  4    Period  Weeks    Status   On-going      PT LONG TERM GOAL #4   Title  Patient to report tolerance of 1 week of HIIT workouts without pain limiting.    Time  4    Period  Weeks    Status  On-going      PT LONG TERM GOAL #5   Title  Patient to report 80% improvement in L knee pain.    Time  4    Period  Weeks    Status  On-going            Plan - 10/14/18 1406    Clinical Impression Statement  Patient arrived to session with report of resuming her workouts- noting feeling of "giving out" in the L knee and increased swelling the longer she works out, but overall tolerating things well. Reviewed HEP with patient requiring small corrections of form with modified Thomas stretch and SLR. Able to progress bridge with banded resistance around knees and SLR with ER with good tolerance. Worked on weighted hamstring strength with patient demonstrating good motor control. Ended session with KT tape for sensitive skin to L knee for pain relief during workouts. Patient reported understanding of wear time, precautions, and removal. No complaints at end of session.    Comorbidities  chronic urticaria, HA, asthma, hernia repair    PT Treatment/Interventions  ADLs/Self Care Home Management;Cryotherapy;Electrical Stimulation;Iontophoresis 4mg /ml Dexamethasone;Moist Heat;Ultrasound;Gait training;Stair training;Functional mobility training;Therapeutic activities;Therapeutic exercise;Balance training;Manual techniques;Orthotic Fit/Training;Patient/family education;Neuromuscular re-education;Passive range of motion;Dry needling;Energy conservation;Splinting;Taping;Vasopneumatic Device    PT Next Visit Plan  progress LE strengthening    Consulted and Agree with Plan of Care  Patient       Patient will benefit from skilled therapeutic intervention in order to improve the following deficits and impairments:  Increased edema, Decreased activity tolerance, Decreased strength, Pain, Difficulty walking, Decreased balance, Improper body  mechanics, Postural dysfunction, Impaired flexibility  Visit Diagnosis: 1. Acute pain of left knee   2. Other symptoms and signs involving the musculoskeletal system        Problem List Patient Active Problem List  Diagnosis Date Noted  . Chronic urticaria 08/20/2018  . Chronic rhinitis 08/20/2018  . Mild intermittent asthma, uncomplicated 08/20/2018  . Postpartum care following vaginal delivery (5/11) 08/10/2014  . Pregnancy 08/09/2014  . Concentration deficit 12/27/2012  . Anxiety state, unspecified 12/27/2012  . GERD (gastroesophageal reflux disease) 09/05/2012  . Seasonal allergies 09/05/2012  . Shortness of breath 09/05/2012  . Cough 09/05/2012    Anette GuarneriYevgeniya Asberry Lascola, PT, DPT 10/14/18 2:09 PM   [facility] [address]  [address] [place], [place], [address] Phone: [phone]   Fax:  [phone]  Name: Joyce SportSharna Dixon Mitrano MRN: [id number] Date of Birth: 10-28-1975

## 2018-10-21 ENCOUNTER — Encounter: Payer: Self-pay | Admitting: Physical Therapy

## 2018-10-21 ENCOUNTER — Other Ambulatory Visit: Payer: Self-pay

## 2018-10-21 ENCOUNTER — Ambulatory Visit: Payer: 59 | Admitting: Physical Therapy

## 2018-10-21 DIAGNOSIS — M25562 Pain in left knee: Secondary | ICD-10-CM

## 2018-10-21 DIAGNOSIS — R29898 Other symptoms and signs involving the musculoskeletal system: Secondary | ICD-10-CM

## 2018-10-21 NOTE — Therapy (Signed)
[facility] [address]  [address] [place], [place], [address] Phone: [phone]   Fax:  [phone]  Physical Therapy Treatment  Patient Details  Name: [name] MRN: [id number] Date of Birth: 03/13/1976 Referring Provider (PT): Ramond Marrowax Varkey, MD   Encounter Date: 10/21/2018  PT End of Session - 10/21/18 1359    Visit Number  3    Number of Visits  5    Date for PT Re-Evaluation  11/03/18    Authorization Type  UHC    PT Start Time  1318    PT Stop Time  1357    PT Time Calculation (min)  39 min    Activity Tolerance  Patient tolerated treatment well    Behavior During Therapy  [facility] for tasks assessed/performed       Past Medical History:  Diagnosis Date  . Asthma   . Headache(784.0)   . Postpartum care following vaginal delivery (5/11) 08/10/2014  . Urticaria     Past Surgical History:  Procedure Laterality Date  . DILATION AND CURETTAGE OF UTERUS    . HERNIA REPAIR    . INTRAUTERINE DEVICE (IUD) INSERTION      There were no vitals filed for this visit.  Subjective Assessment - 10/21/18 1319    Subjective  Reports that she did feel some benefit from taping and did not notice any irritation. However, it came off after one day. Having some soreness today after "over doing it" while working out on the bleachers.    Pertinent History  chronic urticaria, HA, asthma, hernia repair    Diagnostic tests  09/13/18 L knee xray: negative    Patient Stated Goals  build strength, ROM and return to running and working out    Currently in Pain?  Yes    Pain Score  5     Pain Location  Knee    Pain Orientation  Left    Pain Descriptors / Indicators  Discomfort    Pain Type  Acute pain                       [facility] Adult PT Treatment/Exercise - 10/21/18 0001      Knee/Hip Exercises: Stretches   Passive Hamstring Stretch  Left;2 reps;30 seconds    Passive Hamstring Stretch Limitations  supine strap     Quad Stretch  Left;30 seconds;4 reps    Quad Stretch Limitations  prone with strap and folded pillow under knee    Hip Flexor Stretch  Left;2 reps;30 seconds    Hip Flexor Stretch Limitations  mod thomas stretch with strap      Knee/Hip Exercises: Aerobic   Recumbent Bike  L3 x 6min       Knee/Hip Exercises: Standing   Terminal Knee Extension  Strengthening;Left;1 set;10 reps    Theraband Level (Terminal Knee Extension)  Level 4 (Blue)    Terminal Knee Extension Limitations  mini squat + TKE 10x3"    Functional Squat  1 set;10 reps    Functional Squat Limitations  TRX squat to tolerance   cues for L wt shift   Other Standing Knee Exercises  L RDL to bolster on mat x10   visible L kne instability     Knee/Hip Exercises: Supine   Single Leg Bridge  Strengthening;Both;1 set;10 reps   marching bridge   Straight Leg Raises  Strengthening;Left;1 set;10 reps    Straight Leg Raises Limitations  2#; cues  for TKE when lowering   good correction after cues            PT Education - 10/21/18 1358    Education Details  update to HEP    Person(s) Educated  Patient    Methods  Explanation;Demonstration;Tactile cues;Verbal cues;Handout    Comprehension  Verbalized understanding;Returned demonstration       PT Short Term Goals - 10/21/18 1549      PT SHORT TERM GOAL #1   Title  Patient to be independent with initial HEP.    Time  2    Period  Weeks    Status  Achieved    Target Date  10/20/18        PT Long Term Goals - 10/14/18 1408      PT LONG TERM GOAL #1   Title  Patient to be independent with advanced HEP.    Time  4    Period  Weeks    Status  On-going      PT LONG TERM GOAL #2   Title  Patient to demonstrate no tightness in B hip flexors and hamstrings.    Time  4    Period  Weeks    Status  On-going      PT LONG TERM GOAL #3   Title  Patient to demonstrate good knee stability and reciprocal pattern without handrail up/down 13 steps without pain limiting.     Time  4    Period  Weeks    Status  On-going      PT LONG TERM GOAL #4   Title  Patient to report tolerance of 1 week of HIIT workouts without pain limiting.    Time  4    Period  Weeks    Status  On-going      PT LONG TERM GOAL #5   Title  Patient to report 80% improvement in L knee pain.    Time  4    Period  Weeks    Status  On-going            Plan - 10/21/18 1400    Clinical Impression Statement  Patient reporting L knee discomfort after working out on the bleachers. Tolerated gentle stretching well. Able to tolerate increased weighted resistance with SLR today; patient still with tendency to lose quad contraction during this exercise but with good ability to correct according to cues. Patient requiring cues to shift weight L with squats d/t compensatory strategy but with otherwise good squat form. Introduced single leg RDLs on L LE with patient demonstrating knee instability and lack of balance. Updated HEP with exercises that were well-tolerated today. Patient reported understanding and with no complaints at end of session.    Comorbidities  chronic urticaria, HA, asthma, hernia repair    PT Treatment/Interventions  ADLs/Self Care Home Management;Cryotherapy;Electrical Stimulation;Iontophoresis 4mg /ml Dexamethasone;Moist Heat;Ultrasound;Gait training;Stair training;Functional mobility training;Therapeutic activities;Therapeutic exercise;Balance training;Manual techniques;Orthotic Fit/Training;Patient/family education;Neuromuscular re-education;Passive range of motion;Dry needling;Energy conservation;Splinting;Taping;Vasopneumatic Device    PT Next Visit Plan  progress LE strengthening    Consulted and Agree with Plan of Care  Patient       Patient will benefit from skilled therapeutic intervention in order to improve the following deficits and impairments:  Increased edema, Decreased activity tolerance, Decreased strength, Pain, Difficulty walking, Decreased balance,  Improper body mechanics, Postural dysfunction, Impaired flexibility  Visit Diagnosis: 1. Acute pain of left knee   2. Other symptoms and signs involving the musculoskeletal system  Problem List Patient Active Problem List   Diagnosis Date Noted  . Chronic urticaria 08/20/2018  . Chronic rhinitis 08/20/2018  . Mild intermittent asthma, uncomplicated 70/04/7492  . Postpartum care following vaginal delivery (5/11) 08/10/2014  . Pregnancy 08/09/2014  . Concentration deficit 12/27/2012  . Anxiety state, unspecified 12/27/2012  . GERD (gastroesophageal reflux disease) 09/05/2012  . Seasonal allergies 09/05/2012  . Shortness of breath 09/05/2012  . Cough 09/05/2012     Janene Harvey, PT, DPT 10/21/18 3:50 PM   [facility] [address]  [place], [place], [address] Phone: [phone]   Fax:  [phone]  Name: [name] MRN: [id number] Date of Birth: June 09, 1975

## 2018-10-28 ENCOUNTER — Ambulatory Visit: Payer: 59 | Admitting: Physical Therapy

## 2018-10-28 ENCOUNTER — Other Ambulatory Visit: Payer: Self-pay

## 2018-10-28 ENCOUNTER — Encounter: Payer: Self-pay | Admitting: Physical Therapy

## 2018-10-28 DIAGNOSIS — M25562 Pain in left knee: Secondary | ICD-10-CM

## 2018-10-28 DIAGNOSIS — R29898 Other symptoms and signs involving the musculoskeletal system: Secondary | ICD-10-CM

## 2018-10-28 NOTE — Therapy (Signed)
[facility] [address]  [place], [place], [address] Phone: [phone]   Fax:  [phone]  Physical Therapy Treatment  Patient Details  Name: [name] MRN: [id number] Date of Birth: 03-Jan-1976 Referring Provider (PT): Ophelia Charter, MD   Encounter Date: 10/28/2018  PT End of Session - 10/28/18 1353    Visit Number  4    Number of Visits  5    Date for PT Re-Evaluation  11/03/18    Authorization Type  UHC    PT Start Time  8756    PT Stop Time  1354    PT Time Calculation (min)  40 min    Activity Tolerance  Patient tolerated treatment well    Behavior During Therapy  [facility] for tasks assessed/performed       Past Medical History:  Diagnosis Date  . Asthma   . Headache(784.0)   . Postpartum care following vaginal delivery (5/11) 08/10/2014  . Urticaria     Past Surgical History:  Procedure Laterality Date  . DILATION AND CURETTAGE OF UTERUS    . HERNIA REPAIR    . INTRAUTERINE DEVICE (IUD) INSERTION      There were no vitals filed for this visit.  Subjective Assessment - 10/28/18 1316    Subjective  Reports that she has noticed that she favors the L leg when working out, feels like she doesn't have confidence in it.    Pertinent History  chronic urticaria, HA, asthma, hernia repair    Diagnostic tests  09/13/18 L knee xray: negative    Patient Stated Goals  build strength, ROM and return to running and working out    Currently in Pain?  No/denies                       [facility] Adult PT Treatment/Exercise - 10/28/18 0001      Knee/Hip Exercises: Stretches   Passive Hamstring Stretch  Left;30 seconds;1 rep    Passive Hamstring Stretch Limitations  supine strap    Hip Flexor Stretch  Left;30 seconds;1 rep    Hip Flexor Stretch Limitations  mod thomas stretch with strap      Knee/Hip Exercises: Aerobic   Recumbent Bike  L3 x 46min       Knee/Hip Exercises: Machines for  Strengthening   Cybex Knee Extension  B conc/ L ecc 2x10 20#   good control   Cybex Knee Flexion  B conc/ L ecc 2x10 20#    Cybex Leg Press  L LE only 10x 25#, 10x 30#      Knee/Hip Exercises: Standing   Other Standing Knee Exercises  single leg squat on L LE to chair with 2 airex and 1 pillow x15   heavy correciton of valgus     Knee/Hip Exercises: Supine   Single Leg Bridge  Strengthening;Left;10 reps;2 sets   cues to maintain opposite LE straight     Manual Therapy   Manual Therapy  Taping    Kinesiotex  Create Space      Kinesiotix   Create Space  L knee chondramalacia patellae pattern               PT Short Term Goals - 10/21/18 1549      PT SHORT TERM GOAL #1   Title  Patient to be independent with initial HEP.    Time  2    Period  Weeks    Status  Achieved    Target Date  10/20/18        PT Long Term Goals - 10/14/18 1408      PT LONG TERM GOAL #1   Title  Patient to be independent with advanced HEP.    Time  4    Period  Weeks    Status  On-going      PT LONG TERM GOAL #2   Title  Patient to demonstrate no tightness in B hip flexors and hamstrings.    Time  4    Period  Weeks    Status  On-going      PT LONG TERM GOAL #3   Title  Patient to demonstrate good knee stability and reciprocal pattern without handrail up/down 13 steps without pain limiting.    Time  4    Period  Weeks    Status  On-going      PT LONG TERM GOAL #4   Title  Patient to report tolerance of 1 week of HIIT workouts without pain limiting.    Time  4    Period  Weeks    Status  On-going      PT LONG TERM GOAL #5   Title  Patient to report 80% improvement in L knee pain.    Time  4    Period  Weeks    Status  On-going            Plan - 10/28/18 1354    Clinical Impression Statement  Patient reporting awareness of favoring L LE during her workouts at home d/t lack of confidence in L knee. Focused on L LE strengthening ther-ex today in order to focus on  strength improvements on impaired LE. Tolerated machine strengthening with focus on B concentric/L eccentric muscle contraction. Patient able to demonstrate good eccentric control. Demonstrated L knee instability and valgus collapse with very shallow single leg squat, requiring manual cues to correct. Ended session with KT tape application to L knee. Advised patient to remove tape immediately if irritation occurs. Patient reported understanding. Patient progressing per POC.    Comorbidities  chronic urticaria, HA, asthma, hernia repair    PT Treatment/Interventions  ADLs/Self Care Home Management;Cryotherapy;Electrical Stimulation;Iontophoresis 4mg /ml Dexamethasone;Moist Heat;Ultrasound;Gait training;Stair training;Functional mobility training;Therapeutic activities;Therapeutic exercise;Balance training;Manual techniques;Orthotic Fit/Training;Patient/family education;Neuromuscular re-education;Passive range of motion;Dry needling;Energy conservation;Splinting;Taping;Vasopneumatic Device    PT Next Visit Plan  progress LE strengthening    Consulted and Agree with Plan of Care  Patient       Patient will benefit from skilled therapeutic intervention in order to improve the following deficits and impairments:  Increased edema, Decreased activity tolerance, Decreased strength, Pain, Difficulty walking, Decreased balance, Improper body mechanics, Postural dysfunction, Impaired flexibility  Visit Diagnosis: 1. Acute pain of left knee   2. Other symptoms and signs involving the musculoskeletal system        Problem List Patient Active Problem List   Diagnosis Date Noted  . Chronic urticaria 08/20/2018  . Chronic rhinitis 08/20/2018  . Mild intermittent asthma, uncomplicated 08/20/2018  . Postpartum care following vaginal delivery (5/11) 08/10/2014  . Pregnancy 08/09/2014  . Concentration deficit 12/27/2012  . Anxiety state, unspecified 12/27/2012  . GERD (gastroesophageal reflux disease)  09/05/2012  . Seasonal allergies 09/05/2012  . Shortness of breath 09/05/2012  . Cough 09/05/2012    Anette GuarneriYevgeniya Toia Micale, PT, DPT 10/28/18 2:00 PM   [facility] [address]  [address] [place], [place], [address] Phone: [phone]  Fax:  [phone]  Name: Sherrie SportSharna Dixon Elliott MRN: [id number] Date of Birth: 1976/02/15

## 2018-11-04 ENCOUNTER — Encounter: Payer: Self-pay | Admitting: Physical Therapy

## 2018-11-04 ENCOUNTER — Other Ambulatory Visit: Payer: Self-pay

## 2018-11-04 ENCOUNTER — Ambulatory Visit: Payer: 59 | Attending: Orthopaedic Surgery | Admitting: Physical Therapy

## 2018-11-04 DIAGNOSIS — R29898 Other symptoms and signs involving the musculoskeletal system: Secondary | ICD-10-CM | POA: Diagnosis present

## 2018-11-04 DIAGNOSIS — M25562 Pain in left knee: Secondary | ICD-10-CM | POA: Diagnosis present

## 2018-11-04 NOTE — Therapy (Addendum)
[facility] [address]  [place], [place], [address] Phone: [phone]   Fax:  [phone]  Physical Therapy Treatment  Patient Details  Name: [name] MRN: [id number] Date of Birth: 08/06/1975 Referring Provider (PT): Ophelia Charter, MD   Encounter Date: 11/04/2018  PT End of Session - 11/04/18 1407    Visit Number  5    Number of Visits  5    Date for PT Re-Evaluation  11/03/18    Authorization Type  [employer]    PT Start Time  8003    PT Stop Time  1357    PT Time Calculation (min)  42 min    Activity Tolerance  Patient tolerated treatment well    Behavior During Therapy  [facility] for tasks assessed/performed       Past Medical History:  Diagnosis Date  . Asthma   . Headache(784.0)   . Postpartum care following vaginal delivery (5/11) 08/10/2014  . Urticaria     Past Surgical History:  Procedure Laterality Date  . DILATION AND CURETTAGE OF UTERUS    . HERNIA REPAIR    . INTRAUTERINE DEVICE (IUD) INSERTION      There were no vitals filed for this visit.  Subjective Assessment - 11/04/18 1314    Subjective  Reports that she has been doing well. Reports 95% improvement. Feels like she is still lacking confidence in her L knee. Got a lot of benefit from taping last session.    Pertinent History  chronic urticaria, HA, asthma, hernia repair    Diagnostic tests  09/13/18 L knee xray: negative    Patient Stated Goals  build strength, ROM and return to running and working out    Currently in Pain?  No/denies         [facility] PT Assessment - 11/04/18 0001      Observation/Other Assessments   Focus on Therapeutic Outcomes (FOTO)   Knee: 91 (9% limited, 18% predicted)      Flexibility   Soft Tissue Assessment /Muscle Length  yes    Hamstrings  L WNL    Quadriceps  L hip flexor mildly tight                   [facility] Adult PT Treatment/Exercise - 11/04/18 0001      Ambulation/Gait   Stairs   Yes    Stairs Assistance  7: Independent    Stair Management Technique  No rails;Alternating pattern    Gait Comments  good stability and eccentric quad strength      Knee/Hip Exercises: Stretches   Control and instrumentation engineer reps;30 seconds    Quad Stretch Limitations  prone with strap and folded pillow under knee      Knee/Hip Exercises: Aerobic   Elliptical  L2 x 50mn      Knee/Hip Exercises: Standing   Other Standing Knee Exercises  single leg squat on L LE to chair with 2 airex and 1 pillow x10   much improved form     Knee/Hip Exercises: Sidelying   Hip ABduction  Strengthening;Right;Left;1 set;10 reps    Hip ABduction Limitations  glute med bias    Other Sidelying Knee/Hip Exercises  standing at counter R & L hip abduction with glute med bias x10       Manual Therapy   Manual Therapy  Taping    KJordan  L knee chondramalacia patellae pattern             PT Education - 11/04/18 1358    Education Details  update to HEP, discussion of progress with PT    Person(s) Educated  Patient    Methods  Explanation;Demonstration;Tactile cues;Verbal cues;Handout    Comprehension  Verbalized understanding;Returned demonstration       PT Short Term Goals - 11/04/18 1319      PT SHORT TERM GOAL #1   Title  Patient to be independent with initial HEP.    Time  2    Period  Weeks    Status  Achieved    Target Date  10/20/18        PT Long Term Goals - 11/04/18 1319      PT LONG TERM GOAL #1   Title  Patient to be independent with advanced HEP.    Time  4    Period  Weeks    Status  Achieved      PT LONG TERM GOAL #2   Title  Patient to demonstrate no tightness in B hip flexors and hamstrings.    Time  4    Period  Weeks    Status  Partially Met   mild tightness remaining in L hip flexor     PT LONG TERM GOAL #3   Title  Patient to demonstrate good knee stability and reciprocal pattern without handrail up/down 13  steps without pain limiting.    Time  4    Period  Weeks    Status  Achieved      PT LONG TERM GOAL #4   Title  Patient to report tolerance of 1 week of HIIT workouts without pain limiting.    Time  4    Period  Weeks    Status  Achieved   reporting no pain, but still noting mild instability     PT LONG TERM GOAL #5   Title  Patient to report 80% improvement in L knee pain.    Time  4    Period  Weeks    Status  Achieved   reports 100% improvement           Plan - 11/04/18 1408    Clinical Impression Statement  Patient reported 95% improvement in L knee at beginning of session. Noted that she no longer has pain, only with remaining lack of confidence in the L knee. Patient has met or partially met all goals at this time. Has been compliant with HEP and her HIIT program 2x/week. Reports no pain while working out. Able to demonstrate good stability and eccentric quad strength when ascending and descending stairs without handrail. Showing good hamstring flexibility, with mild tightness remaining in L hip flexor. Worked on consolidating HEP to address lateral hip strength and knee stability. Patient demonstrating much improved single leg squat today compared to last session. Ended session with KT taping to L knee with step-by-step instruction for continued benefit at home. Patient reported understanding of all education provided today. Patient has shown good progress with PT and is placed on 30 day hold at this time.    Comorbidities  chronic urticaria, HA, asthma, hernia repair    PT Treatment/Interventions  ADLs/Self Care Home Management;Cryotherapy;Electrical Stimulation;Iontophoresis 25m/ml Dexamethasone;Moist Heat;Ultrasound;Gait training;Stair training;Functional mobility training;Therapeutic activities;Therapeutic exercise;Balance training;Manual techniques;Orthotic Fit/Training;Patient/family education;Neuromuscular re-education;Passive range of motion;Dry needling;Energy  conservation;Splinting;Taping;Vasopneumatic Device    PT Next Visit Plan  30 day hold at this time  Consulted and Agree with Plan of Care  Patient       Patient will benefit from skilled therapeutic intervention in order to improve the following deficits and impairments:  Increased edema, Decreased activity tolerance, Decreased strength, Pain, Difficulty walking, Decreased balance, Improper body mechanics, Postural dysfunction, Impaired flexibility  Visit Diagnosis: 1. Acute pain of left knee   2. Other symptoms and signs involving the musculoskeletal system        Problem List Patient Active Problem List   Diagnosis Date Noted  . Chronic urticaria 08/20/2018  . Chronic rhinitis 08/20/2018  . Mild intermittent asthma, uncomplicated 20/76/1915  . Postpartum care following vaginal delivery (5/11) 08/10/2014  . Pregnancy 08/09/2014  . Concentration deficit 12/27/2012  . Anxiety state, unspecified 12/27/2012  . GERD (gastroesophageal reflux disease) 09/05/2012  . Seasonal allergies 09/05/2012  . Shortness of breath 09/05/2012  . Cough 09/05/2012     Janene Harvey, PT, DPT 11/04/18 2:18 PM   [facility] [address]  [place], [place], [address] Phone: [phone]   Fax:  [phone]  Name: Delayne Sanzo MRN: [id number] Date of Birth: 06-16-1975  PHYSICAL THERAPY DISCHARGE SUMMARY  Visits from Start of Care: 5  Current functional level related to goals / functional outcomes: See above clinical impression   Remaining deficits: Tightness in L hip flexor   Education / Equipment: HEP  Plan: Patient agrees to discharge.  Patient goals were partially met. Patient is being discharged due to meeting the stated rehab goals.  ?????     Janene Harvey, PT, DPT 12/09/18 11:34 AM

## 2019-06-12 ENCOUNTER — Other Ambulatory Visit: Payer: Self-pay | Admitting: Family Medicine

## 2019-06-14 NOTE — Telephone Encounter (Signed)
Pt needs ov for any refills.

## 2019-11-29 ENCOUNTER — Other Ambulatory Visit: Payer: 59

## 2020-01-11 ENCOUNTER — Telehealth: Payer: Self-pay

## 2020-01-11 NOTE — Telephone Encounter (Signed)
Pt would like advice on if she should receive 3rd dose of Moderna vaccine. Pt states she is asthmatic

## 2020-01-12 NOTE — Telephone Encounter (Signed)
Phone call to patient.  Questioned if she should get the COVID booster shot.  Reported she has Asthma, and typically has flare-ups when the seasons change.  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 Denied being on any steroid therapy, other than an Inhaler prn.  Advised pt. To check with her PCP re: recommendation for getting the [facility] booster, as it is recommended only for immunocompromised patients at this time.  Verb. Understanding.

## 2020-06-04 ENCOUNTER — Other Ambulatory Visit: Payer: Self-pay | Admitting: Family Medicine

## 2020-06-05 ENCOUNTER — Telehealth: Payer: Self-pay

## 2020-06-05 ENCOUNTER — Ambulatory Visit: Payer: 59 | Admitting: Allergy & Immunology

## 2020-06-05 ENCOUNTER — Other Ambulatory Visit: Payer: Self-pay

## 2020-06-05 ENCOUNTER — Encounter: Payer: Self-pay | Admitting: Allergy & Immunology

## 2020-06-05 VITALS — BP 112/78 | HR 95 | Temp 98.8°F | Resp 16 | Ht 67.0 in | Wt 214.8 lb

## 2020-06-05 DIAGNOSIS — J3089 Other allergic rhinitis: Secondary | ICD-10-CM | POA: Diagnosis not present

## 2020-06-05 DIAGNOSIS — L508 Other urticaria: Secondary | ICD-10-CM | POA: Diagnosis not present

## 2020-06-05 DIAGNOSIS — J0101 Acute recurrent maxillary sinusitis: Secondary | ICD-10-CM | POA: Diagnosis not present

## 2020-06-05 DIAGNOSIS — J4520 Mild intermittent asthma, uncomplicated: Secondary | ICD-10-CM

## 2020-06-05 DIAGNOSIS — J302 Other seasonal allergic rhinitis: Secondary | ICD-10-CM

## 2020-06-05 MED ORDER — METHYLPREDNISOLONE ACETATE 40 MG/ML IJ SUSP
40.0000 mg | Freq: Once | INTRAMUSCULAR | Status: AC
  Administered 2020-06-05: 40 mg via INTRAMUSCULAR

## 2020-06-05 NOTE — Progress Notes (Signed)
FOLLOW UP  Date of Service/Encounter:  06/05/20   Assessment:   Chronic urticaria - largely resolved   Mild intermittent asthma, uncomplicated  Chronic rhinitis (dust mites, dog, cat, grasses, trees, weeds, ragweed)  Acute sinusitis - s/p amoxicillin (given Depo Medrol today per patient request, although I would not be surprised if she needed Augmentin)  New dog in the home  Plan/Recommendations:   1. Perennial and seasonal allergic rhinitis  (dust mites, dog, cat, grasses, trees, weeds, ragweed) - Continue with nasal saline rinses daily. - Try to do Flonase one spray per nostril daily every day. - Add on Astelin one spray per nostril daily.  - Hopefully this combination will prevent you from getting sinus infections in the future. - DepoMedrol injection provided today to help break up your sinuses (call us if you are not feeling better in 48 hours). - Consider allergy shots.   2. Return in about 6 months (around 12/06/2020).   Subjective:   [name] is a 45 y.o. female presenting today for follow up of  Chief Complaint  Patient presents with  . Urticaria    [name] has a history of the following: Patient Active Problem List   Diagnosis Date Noted  . Chronic urticaria 08/20/2018  . Chronic rhinitis 08/20/2018  . Mild intermittent asthma, uncomplicated 08/20/2018  . Postpartum care following vaginal delivery (5/11) 08/10/2014  . Pregnancy 08/09/2014  . Concentration deficit 12/27/2012  . Anxiety state, unspecified 12/27/2012  . GERD (gastroesophageal reflux disease) 09/05/2012  . Seasonal allergies 09/05/2012  . Shortness of breath 09/05/2012  . Cough 09/05/2012    History obtained from: chart review and patient.  [name] is a 45 y.o. female presenting for a follow up visit. She was last seen in May 2020 via televisit.  At that time, she was continued on hydroxyzine up to 3 times daily.  Famotidine and montelukast were added.  Xolair  was discussed as a means of an immunomodulatory agent.  Asthma was controlled with albuterol as needed.  Her rhinitis was controlled with cetirizine and Flonase.  During her initial visit in April 2020, we did get quite a few labs to evaluate her urticaria.  Basic food panel had some low positives to wheat, corn, peanut, and soy.  We did not think these were relevant.  A CRP was elevated at 23.  Since last visit, she has mostly done well.  Asthma/Respiratory Symptom History: She has not been needing her inhaler at all recently. [name]'s asthma has been well controlled. She has not required rescue medication, experienced nocturnal awakenings due to lower respiratory symptoms, nor have activities of daily living been limited. She has required no Emergency Department or Urgent Care visits for her asthma. She has required zero courses of systemic steroids for asthma exacerbations since the last visit. ACT score today is 25, indicating excellent asthma symptom control. She is even questioning the diagnosis of this completely.   Allergic Rhinitis Symptom History: She continues to have issues with her nose. She had a sinus infection at the end of February and she got a course of amoxicillin. Since that time, her left side is more stopped with clear mucous that is thick and smelly. She denies fevers. She was COVID tested and was negative. She thinks that the amoxicillin did work to a certain extent. It was initially just for sinus pain and pressure. But the subsequent thick discharge started after. It has been going on for a couple of weeks. She  has Flonase to use which she uses only on a PRN basis. She did use it this morning.   She does use the saline rinses. She also has postnasal drip. This is always a particularly terrible time of the year for her. 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  Otherwise, there have been no changes to her past medical history, surgical  history, family history, or social history.    Review of Systems  Constitutional: Negative.  Negative for chills, fever, malaise/fatigue and weight loss.  HENT: Positive for congestion and sinus pain. Negative for ear discharge and ear pain.   Eyes: Negative for pain, discharge and redness.  Respiratory: Negative for cough, sputum production, shortness of breath and wheezing.   Cardiovascular: Negative.  Negative for chest pain and palpitations.  Gastrointestinal: Negative for abdominal pain, constipation, diarrhea, heartburn, nausea and vomiting.  Skin: Negative.  Negative for itching and rash.  Neurological: Negative for dizziness and headaches.  Endo/Heme/Allergies: Positive for environmental allergies. Does not bruise/bleed easily.       Objective:   Blood pressure 112/78, pulse 95, temperature 98.8 F (37.1 C), temperature source Tympanic, resp. rate 16, height 5\' 7"  (1.702 m), weight 214 lb 12.8 oz (97.4 kg), SpO2 99 %, unknown if currently breastfeeding. Body mass index is 33.64 kg/m.   Physical Exam:  Physical Exam Constitutional:      Appearance: She is well-developed.     Comments: Very pleasant personable female.  HENT:     Head: Normocephalic and atraumatic.     Right Ear: Tympanic membrane, ear canal and external ear normal.     Left Ear: Tympanic membrane, ear canal and external ear normal.     Nose: No nasal deformity, septal deviation, mucosal edema, rhinorrhea or epistaxis.     Right Turbinates: Enlarged and swollen.     Left Turbinates: Enlarged and swollen.     Right Sinus: No maxillary sinus tenderness or frontal sinus tenderness.     Left Sinus: No maxillary sinus tenderness or frontal sinus tenderness.     Comments: Purulent discharge from the right nares.     Mouth/Throat:     Lips: Pink.     Mouth: Oropharynx is clear and moist. Mucous membranes are moist. Mucous membranes are not pale and not dry.     Pharynx: Uvula midline.     Comments:  Cobblestoning in the posterior oropharynx. Eyes:     General:        Right eye: No discharge.        Left eye: No discharge.     Extraocular Movements: EOM normal.     Conjunctiva/sclera: Conjunctivae normal.     Right eye: Right conjunctiva is not injected. No chemosis.    Left eye: Left conjunctiva is not injected. No chemosis.    Pupils: Pupils are equal, round, and reactive to light.  Cardiovascular:     Rate and Rhythm: Normal rate and regular rhythm.     Heart sounds: Normal heart sounds.  Pulmonary:     Effort: Pulmonary effort is normal. No tachypnea, accessory muscle usage or respiratory distress.     Breath sounds: Normal breath sounds. No wheezing, rhonchi or rales.  Chest:     Chest wall: No tenderness.  Lymphadenopathy:     Cervical: No cervical adenopathy.  Skin:    Coloration: Skin is not pale.     Findings: No abrasion, erythema, petechiae or rash. Rash is not papular, urticarial or vesicular.  Neurological:     Mental Status: She is alert.  Psychiatric:        Mood and Affect: Mood and affect normal.        Behavior: Behavior is cooperative.      Diagnostic studies: none       Malachi Bonds, MD  [facility]

## 2020-06-05 NOTE — Telephone Encounter (Signed)
Called and spoke to patient and informed her that she needed an appointment so that we could continue refilling her Singular. Patient expressed that she was seeing too many doctors and that she would ask for a new prescription for the doctor she was going to see today. She denied making an appointment for the future.

## 2020-06-05 NOTE — Patient Instructions (Addendum)
1. Perennial and seasonal allergic rhinitis  (dust mites, dog, cat, grasses, trees, weeds, ragweed) - Continue with nasal saline rinses daily. - Try to do Flonase one spray per nostril daily every day. - Add on Astelin one spray per nostril daily.  - Hopefully this combination will prevent you from getting sinus infections in the future. - DepoMedrol injection provided today to help break up your sinuses (call us if you are not feeling better in 48 hours). - Consider allergy shots.   2. Return in about 6 months (around 12/06/2020).    Please inform us of any Emergency Department visits, hospitalizations, or changes in symptoms. Call us before going to the ED for breathing or allergy symptoms since we might be able to fit you in for a sick visit. Feel free to contact us anytime with any questions, problems, or concerns.  It was a pleasure to see you again today!  Websites that have reliable patient information: 1. American Academy of Asthma, Allergy, and Immunology: www.aaaai.org 2. Food Allergy Research and Education (FARE): foodallergy.org 3. Mothers of Asthmatics: http://www.asthmacommunitynetwork.org 4. American College of Allergy, Asthma, and Immunology: www.acaai.org   COVID-19 Vaccine Information can be found at: [email or web address].[email or web address] For questions related to vaccine distribution or appointments, please email [email or web address] or call [phone].   We realize that you might be concerned about having an allergic reaction to the COVID19 vaccines. To help with that concern, WE ARE OFFERING THE COVID19 VACCINES IN OUR OFFICE! Ask the front desk for dates!     "Like" [place] on Facebook and Instagram for our latest updates!      A healthy democracy works best when [employer] participate! Make sure you are registered to vote! If you have moved or changed any of your contact information, you will need to get this updated before  voting!  In some cases, you MAY be able to register to vote online: [email or web address]

## 2020-06-06 ENCOUNTER — Telehealth: Payer: Self-pay | Admitting: Allergy & Immunology

## 2020-06-06 MED ORDER — MONTELUKAST SODIUM 10 MG PO TABS: 10.0000 mg | ORAL_TABLET | Freq: Every day | ORAL | 5 refills | Status: DC

## 2020-06-06 MED ORDER — FLUTICASONE PROPIONATE 50 MCG/ACT NA SUSP: 1.0000 | Freq: Every day | NASAL | 2 refills | Status: AC

## 2020-06-06 NOTE — Telephone Encounter (Signed)
Sent in Montelukast and Fluticasone nasal spray.

## 2020-06-06 NOTE — Telephone Encounter (Signed)
Pt states she  is waiting on refills for nasal spray and montelukast.

## 2020-07-23 ENCOUNTER — Other Ambulatory Visit: Payer: Self-pay | Admitting: Allergy & Immunology

## 2020-07-24 NOTE — Telephone Encounter (Signed)
Lat seen 06/05/20 and is not due back until September of 2022

## 2020-12-11 ENCOUNTER — Emergency Department (HOSPITAL_COMMUNITY): Payer: 59

## 2020-12-11 ENCOUNTER — Encounter (HOSPITAL_COMMUNITY): Payer: Self-pay

## 2020-12-11 ENCOUNTER — Other Ambulatory Visit: Payer: Self-pay

## 2020-12-11 ENCOUNTER — Emergency Department (HOSPITAL_COMMUNITY)
Admission: EM | Admit: 2020-12-11 | Discharge: 2020-12-12 | Disposition: A | Discharge status: Home / Self Care | Payer: 59 | Attending: Emergency Medicine | Admitting: Emergency Medicine

## 2020-12-11 DIAGNOSIS — R519 Headache, unspecified: Secondary | ICD-10-CM | POA: Diagnosis present

## 2020-12-11 DIAGNOSIS — J320 Chronic maxillary sinusitis: Secondary | ICD-10-CM | POA: Diagnosis not present

## 2020-12-11 DIAGNOSIS — Z91010 Allergy to peanuts: Secondary | ICD-10-CM | POA: Diagnosis not present

## 2020-12-11 DIAGNOSIS — J4520 Mild intermittent asthma, uncomplicated: Secondary | ICD-10-CM | POA: Insufficient documentation

## 2020-12-11 DIAGNOSIS — R202 Paresthesia of skin: Secondary | ICD-10-CM | POA: Diagnosis not present

## 2020-12-11 DIAGNOSIS — Z7951 Long term (current) use of inhaled steroids: Secondary | ICD-10-CM | POA: Insufficient documentation

## 2020-12-11 DIAGNOSIS — G8929 Other chronic pain: Secondary | ICD-10-CM | POA: Diagnosis not present

## 2020-12-11 LAB — CBC WITH DIFFERENTIAL/PLATELET
Abs Immature Granulocytes: 0.02 10*3/uL (ref 0.00–0.07)
Basophils Absolute: 0 10*3/uL (ref 0.0–0.1)
Basophils Relative: 1 %
Eosinophils Absolute: 0.2 10*3/uL (ref 0.0–0.5)
Eosinophils Relative: 3 %
HCT: 38.3 % (ref 36.0–46.0)
Hemoglobin: 13.4 g/dL (ref 12.0–15.0)
Immature Granulocytes: 0 %
Lymphocytes Relative: 37 %
Lymphs Abs: 2.8 10*3/uL (ref 0.7–4.0)
MCH: 31.1 pg (ref 26.0–34.0)
MCHC: 35 g/dL (ref 30.0–36.0)
MCV: 88.9 fL (ref 80.0–100.0)
Monocytes Absolute: 0.6 10*3/uL (ref 0.1–1.0)
Monocytes Relative: 8 %
Neutro Abs: 3.9 10*3/uL (ref 1.7–7.7)
Neutrophils Relative %: 51 %
Platelets: 213 10*3/uL (ref 150–400)
RBC: 4.31 MIL/uL (ref 3.87–5.11)
RDW: 12.5 % (ref 11.5–15.5)
WBC: 7.5 10*3/uL (ref 4.0–10.5)
nRBC: 0 % (ref 0.0–0.2)

## 2020-12-11 LAB — COMPREHENSIVE METABOLIC PANEL
ALT: 21 U/L (ref 0–44)
AST: 19 U/L (ref 15–41)
Albumin: 4.4 g/dL (ref 3.5–5.0)
Alkaline Phosphatase: 53 U/L (ref 38–126)
Anion gap: 6 (ref 5–15)
BUN: 11 mg/dL (ref 6–20)
CO2: 25 mmol/L (ref 22–32)
Calcium: 9.7 mg/dL (ref 8.9–10.3)
Chloride: 111 mmol/L (ref 98–111)
Creatinine, Ser: 0.64 mg/dL (ref 0.44–1.00)
GFR, Estimated: >60 mL/min (ref >60)
Glucose, Bld: 107 mg/dL — ABNORMAL HIGH (ref 70–99)
Potassium: 3.7 mmol/L (ref 3.5–5.1)
Sodium: 142 mmol/L (ref 135–145)
Total Bilirubin: 0.4 mg/dL (ref 0.3–1.2)
Total Protein: 7.8 g/dL (ref 6.5–8.1)

## 2020-12-11 LAB — I-STAT BETA HCG BLOOD, ED (MC, WL, AP ONLY): I-stat hCG, quantitative: <5 m[IU]/mL (ref <5)

## 2020-12-11 MED ORDER — ACETAMINOPHEN 500 MG PO TABS
1000.0000 mg | ORAL_TABLET | Freq: Once | ORAL | Status: DC
  Filled 2020-12-11: qty 2

## 2020-12-11 MED ORDER — IOHEXOL 350 MG/ML SOLN
80.0000 mL | Freq: Once | INTRAVENOUS | Status: AC | PRN
  Administered 2020-12-11: 80 mL via INTRAVENOUS

## 2020-12-11 MED ORDER — SODIUM CHLORIDE 0.9 % IV BOLUS
500.0000 mL | Freq: Once | INTRAVENOUS | Status: AC
  Administered 2020-12-12: 500 mL via INTRAVENOUS

## 2020-12-11 NOTE — ED Triage Notes (Signed)
Patient c/o right arm numbness from the right shoulder to mid forearm x 1 week.  Patient also c/o right sided headache  and dizziness x 2 days. Patient reports a history of headaches, but states the headache 2 days ago was the worst she ever had.

## 2020-12-11 NOTE — ED Provider Notes (Signed)
Emergency Medicine Provider Triage Evaluation Note  [name] , a 45 y.o. female  was evaluated in triage.  Pt complains of headache, right arm numbness, dizziness.  Patient has had intermittent headache for years, had a severe headache 2 days ago on the right side which lasted longer and was more severe than normal.  She also reports dizziness for at least a year.  She has had right arm numbness for 1 week.  No fall, trauma, or injury.  Review of Systems  Positive: Headache, right arm numbness, dizziness Negative: Fever  Physical Exam  BP 139/87 (BP Location: Left Arm)   Pulse 100   Temp 99 F (37.2 C) (Oral)   Resp 16   Ht 5\' 7"  (1.702 m)   Wt 97.5 kg   SpO2 97%   BMI 33.67 kg/m  Gen:   Awake, no distress   Resp:  Normal effort  MSK:   Moves extremities without difficulty  Other:  Patient reports decreased sensation of the upper arm and the forearm.  Same sensation of the hand.  Grip strength equal bilaterally.  Strength of upper extremities equal bilaterally.  CN intact. No ttp of midline c-spine  Medical Decision Making  Medically screening exam initiated at 4:44 PM.  Appropriate orders placed.  was informed that the remainder of the evaluation will be completed by another provider, this initial triage assessment does not replace that evaluation, and the importance of remaining in the ED until their evaluation is complete.  Labs and ct   Sherrie Sport, PA-C 12/11/20 1645    02/10/21, MD 12/11/20 2130

## 2020-12-12 ENCOUNTER — Encounter (HOSPITAL_COMMUNITY): Payer: Self-pay | Admitting: Emergency Medicine

## 2020-12-12 MED ORDER — AMOXICILLIN-POT CLAVULANATE 875-125 MG PO TABS
1.0000 | ORAL_TABLET | Freq: Once | ORAL | Status: AC
  Administered 2020-12-12: 1 via ORAL
  Filled 2020-12-12: qty 1

## 2020-12-12 MED ORDER — KETOROLAC TROMETHAMINE 30 MG/ML IJ SOLN
15.0000 mg | Freq: Once | INTRAMUSCULAR | Status: AC
  Administered 2020-12-12: 15 mg via INTRAVENOUS
  Filled 2020-12-12: qty 1

## 2020-12-12 MED ORDER — AMOXICILLIN-POT CLAVULANATE 875-125 MG PO TABS: 1.0000 | ORAL_TABLET | Freq: Two times a day (BID) | ORAL | 0 refills | Status: DC

## 2020-12-12 NOTE — ED Provider Notes (Signed)
[facility]-EMERGENCY DEPT Provider Note   CSN: [id number] Arrival date & time: 12/11/20  1556     History Chief Complaint  Patient presents with   arm numbness   Headache   Dizziness    [name] is a 45 y.o. female.  The history is provided by the patient.  Headache Pain location:  R temporal Quality:  Sharp Radiates to:  Does not radiate Duration: 4 years. Timing:  Constant Chronicity:  Chronic Similar to prior headaches: yes   Context: not caffeine, not exposure to cold air, not loud noise and not straining   Relieved by:  Nothing Worsened by:  Nothing Ineffective treatments:  None tried Associated symptoms: paresthesias   Associated symptoms: no blurred vision, no cough, no diarrhea, no drainage, no ear pain, no eye pain, no facial pain, no fatigue, no fever, no focal weakness, no loss of balance, no neck pain, no neck stiffness, no photophobia, no seizures, no swollen glands, no syncope, no visual change, no vomiting and no weakness   Dizziness Quality:  Lightheadedness Severity:  Mild Onset quality:  Gradual Duration:  1 day Timing:  Constant Progression:  Unchanged Chronicity:  New Context: not when bending over, not with bowel movement, not with ear pain and not with eye movement   Relieved by:  Nothing Worsened by:  Nothing Associated symptoms: headaches   Associated symptoms: no chest pain, no diarrhea, no syncope, no vision changes, no vomiting and no weakness   Risk factors: no anemia   Patient with chronic headaches presents with R temporal headache for years that has been worse for 1 week.  She has not taken anything for this.  No weakness.  No changes in vision or speech or cognition.  No f/c/r.  No URI symptoms.      Past Medical History:  Diagnosis Date   Asthma    Headache(784.0)    Postpartum care following vaginal delivery (5/11) 08/10/2014   Urticaria     Patient Active Problem List   Diagnosis Date Noted    Chronic urticaria 08/20/2018   Chronic rhinitis 08/20/2018   Mild intermittent asthma, uncomplicated 08/20/2018   Postpartum care following vaginal delivery (5/11) 08/10/2014   Pregnancy 08/09/2014   Concentration deficit 12/27/2012   Anxiety state, unspecified 12/27/2012   GERD (gastroesophageal reflux disease) 09/05/2012   Seasonal allergies 09/05/2012   Shortness of breath 09/05/2012   Cough 09/05/2012    Past Surgical History:  Procedure Laterality Date   DILATION AND CURETTAGE OF UTERUS     HERNIA REPAIR     INTRAUTERINE DEVICE (IUD) INSERTION       OB History     Gravida  7   Para  4   Term  3   Preterm      AB  3   Living  4      SAB  1   IAB  2   Ectopic      Multiple  0   Live Births  4           Family History  Problem Relation Age of Onset   Diabetes Mother    Hypertension Mother    Rheum arthritis Mother    Cancer Father    Cancer Paternal Grandmother    Allergic rhinitis Neg Hx    Asthma Neg Hx    Eczema Neg Hx    Urticaria Neg Hx     Social History   Tobacco Use   Smoking status:  Never   Smokeless tobacco: Never  Vaping Use   Vaping Use: Never used  Substance Use Topics   Alcohol use: Yes    Comment: Occasional   Drug use: No    Home Medications Prior to Admission medications   Medication Sig Start Date End Date Taking? Authorizing Provider  amoxicillin-clavulanate (AUGMENTIN) 875-125 MG tablet Take 1 tablet by mouth 2 (two) times daily. One po bid x 7 days 12/12/20  Yes Maybel Dambrosio, MD  albuterol (VENTOLIN HFA) 108 (90 Base) MCG/ACT inhaler Inhale 2 puffs into the lungs every 6 (six) hours as needed for wheezing or shortness of breath. Patient not taking: Reported on 06/05/2020 08/20/18   Hetty Blend, FNP  azelastine (ASTELIN) 0.1 % nasal spray Place 2 sprays into both nostrils as needed. 01/17/20   [provider]  cetirizine (ZYRTEC) 10 MG tablet Take 10 mg by mouth as needed.    [provider]   Clobetasol Prop Emollient Base 0.05 % emollient cream  07/07/18   [provider]  famotidine (PEPCID) 20 MG tablet Take 1 tablet (20 mg total) by mouth 2 (two) times daily. Patient not taking: Reported on 06/05/2020 08/20/18   Hetty Blend, FNP  fluticasone [facility]) 50 MCG/ACT nasal spray Place 1 spray into both nostrils daily. 06/06/20   Alfonse Spruce, MD  HYDROcodone-acetaminophen (NORCO/VICODIN) 5-325 MG tablet Take 1 tablet by mouth every 6 (six) hours as needed. Patient not taking: Reported on 06/05/2020 09/13/18   Henderly, Britni A, PA-C  hydrOXYzine (ATARAX/VISTARIL) 25 MG tablet TAKE 1/2   1 TAB BY MOUTH 3 TIMES A DAY AS NEEDED FOR ITCHING/HIVES Patient not taking: Reported on 06/05/2020 07/30/18   [provider]  ibuprofen (ADVIL,MOTRIN) 200 MG tablet Take 3 tablets (600 mg total) by mouth every 6 (six) hours as needed. Patient not taking: Reported on 06/05/2020 08/12/14   Donette Larry, CNM  montelukast (SINGULAIR) 10 MG tablet TAKE 1 TABLET BY MOUTH EVERYDAY AT BEDTIME 07/24/20   Alfonse Spruce, MD    Allergies    Multihance [gadobenate], Other, Peanuts [peanut oil], and Pollen extract  Review of Systems   Review of Systems  Constitutional:  Negative for chills, fatigue and fever.  HENT:  Negative for ear pain, facial swelling and postnasal drip.   Eyes:  Negative for blurred vision, photophobia, pain, redness and visual disturbance.  Respiratory:  Negative for cough.   Cardiovascular:  Negative for chest pain and syncope.  Gastrointestinal:  Negative for diarrhea and vomiting.  Genitourinary:  Negative for difficulty urinating.  Musculoskeletal:  Negative for neck pain and neck stiffness.  Skin:  Negative for rash.  Neurological:  Positive for headaches and paresthesias. Negative for focal weakness, seizures, syncope, facial asymmetry, speech difficulty, weakness and loss of balance.  Psychiatric/Behavioral:  Negative for confusion.   All other  systems reviewed and are negative.  Physical Exam Updated Vital Signs BP 127/84   Pulse 66   Temp 99 F (37.2 C) (Oral)   Resp 17   Ht 5\' 7"  (1.702 m)   Wt 97.5 kg   SpO2 99%   BMI 33.67 kg/m   Physical Exam Vitals and nursing note reviewed.  Constitutional:      General: She is not in acute distress.    Appearance: Normal appearance.     Comments: Texting on the phone in the room comfortably with all the lights on   HENT:     Head: Normocephalic and atraumatic.  Nose: Nose normal.     Mouth/Throat:     Mouth: Mucous membranes are moist.     Pharynx: Oropharynx is clear.  Eyes:     Extraocular Movements: Extraocular movements intact.     Conjunctiva/sclera: Conjunctivae normal.     Pupils: Pupils are equal, round, and reactive to light.     Comments: No proptosis, EOMI, disks are sharp, intact cognition   Cardiovascular:     Rate and Rhythm: Normal rate and regular rhythm.     Pulses: Normal pulses.     Heart sounds: Normal heart sounds.  Pulmonary:     Effort: Pulmonary effort is normal.     Breath sounds: Normal breath sounds.  Abdominal:     General: Abdomen is flat. Bowel sounds are normal.     Palpations: Abdomen is soft.     Tenderness: There is no abdominal tenderness. There is no guarding or rebound.  Musculoskeletal:        General: Normal range of motion.     Cervical back: Normal range of motion and neck supple.  Skin:    General: Skin is warm and dry.     Capillary Refill: Capillary refill takes less than 2 seconds.  Neurological:     General: No focal deficit present.     Mental Status: She is alert and oriented to person, place, and time.     Cranial Nerves: No cranial nerve deficit.     Sensory: No sensory deficit.     Deep Tendon Reflexes: Reflexes normal.     Comments: Sensory all division tested and intact to confrontation of the RUE, symmetric with LUE  Psychiatric:        Mood and Affect: Mood normal.        Behavior: Behavior normal.     ED Results / Procedures / Treatments   Labs (all labs ordered are listed, but only abnormal results are displayed) Results for orders placed or performed during the hospital encounter of 12/11/20  CBC with Differential  Result Value Ref Range   WBC 7.5 4.0 - 10.5 K/uL   RBC 4.31 3.87 - 5.11 MIL/uL   Hemoglobin 13.4 12.0 - 15.0 g/dL   HCT 74.8 27.0 - 78.6 %   MCV 88.9 80.0 - 100.0 fL   MCH 31.1 26.0 - 34.0 pg   MCHC 35.0 30.0 - 36.0 g/dL   RDW 75.4 49.2 - 01.0 %   Platelets 213 150 - 400 K/uL   nRBC 0.0 0.0 - 0.2 %   Neutrophils Relative % 51 %   Neutro Abs 3.9 1.7 - 7.7 K/uL   Lymphocytes Relative 37 %   Lymphs Abs 2.8 0.7 - 4.0 K/uL   Monocytes Relative 8 %   Monocytes Absolute 0.6 0.1 - 1.0 K/uL   Eosinophils Relative 3 %   Eosinophils Absolute 0.2 0.0 - 0.5 K/uL   Basophils Relative 1 %   Basophils Absolute 0.0 0.0 - 0.1 K/uL   Immature Granulocytes 0 %   Abs Immature Granulocytes 0.02 0.00 - 0.07 K/uL  Comprehensive metabolic panel  Result Value Ref Range   Sodium 142 135 - 145 mmol/L   Potassium 3.7 3.5 - 5.1 mmol/L   Chloride 111 98 - 111 mmol/L   CO2 25 22 - 32 mmol/L   Glucose, Bld 107 (H) 70 - 99 mg/dL   BUN 11 6 - 20 mg/dL   Creatinine, Ser 0.71 0.44 - 1.00 mg/dL   Calcium 9.7 8.9 - 21.9 mg/dL  Total Protein 7.8 6.5 - 8.1 g/dL   Albumin 4.4 3.5 - 5.0 g/dL   AST 19 15 - 41 U/L   ALT 21 0 - 44 U/L   Alkaline Phosphatase 53 38 - 126 U/L   Total Bilirubin 0.4 0.3 - 1.2 mg/dL   GFR, Estimated >85 >46 mL/min   Anion gap 6 5 - 15  I-Stat Beta hCG blood, ED ([facility], [facility], [facility] only)  Result Value Ref Range   I-stat hCG, quantitative <5.0 <5 mIU/mL   Comment 3           CT Angio Head W or Wo Contrast  Result Date: 12/12/2020 CLINICAL DATA:  Facial trauma EXAM: CT ANGIOGRAPHY HEAD AND NECK TECHNIQUE: Multidetector CT imaging of the head and neck was performed using the standard protocol during bolus administration of intravenous contrast. Multiplanar CT image  reconstructions and MIPs were obtained to evaluate the vascular anatomy. Carotid stenosis measurements (when applicable) are obtained utilizing NASCET criteria, using the distal internal carotid diameter as the denominator. CONTRAST:  10mL OMNIPAQUE IOHEXOL 350 MG/ML SOLN COMPARISON:  None. FINDINGS: CTA NECK FINDINGS SKELETON: There is no bony spinal canal stenosis. No lytic or blastic lesion. OTHER NECK: Normal pharynx, larynx and major salivary glands. No cervical lymphadenopathy. Unremarkable thyroid gland. UPPER CHEST: No pneumothorax or pleural effusion. No nodules or masses. AORTIC ARCH: There is no calcific atherosclerosis of the aortic arch. There is no aneurysm, dissection or hemodynamically significant stenosis of the visualized portion of the aorta. Conventional 3 vessel aortic branching pattern. The visualized proximal subclavian arteries are widely patent. RIGHT CAROTID SYSTEM: Normal without aneurysm, dissection or stenosis. LEFT CAROTID SYSTEM: Normal without aneurysm, dissection or stenosis. VERTEBRAL ARTERIES: Left dominant configuration. Both origins are clearly patent. There is no dissection, occlusion or flow-limiting stenosis to the skull base (V1-V3 segments). CTA HEAD FINDINGS POSTERIOR CIRCULATION: --Vertebral arteries: Normal V4 segments. --Inferior cerebellar arteries: Normal. --Basilar artery: Normal. --Superior cerebellar arteries: Normal. --Posterior cerebral arteries (PCA): Normal. ANTERIOR CIRCULATION: --Intracranial internal carotid arteries: Normal. --Anterior cerebral arteries (ACA): Normal. Both A1 segments are present. Patent anterior communicating artery (a-comm). --Middle cerebral arteries (MCA): Normal. VENOUS SINUSES: As permitted by contrast timing, patent. ANATOMIC VARIANTS: None Review of the MIP images confirms the above findings. IMPRESSION: Normal CTA of the head and neck. Electronically Signed   By: Deatra Robinson M.D.   On: 12/12/2020 00:25   CT HEAD WO CONTRAST  ( )  Result Date: 12/11/2020 CLINICAL DATA:  Nonspecific dizziness. right arm numbness, dizziness EXAM: CT HEAD WITHOUT CONTRAST TECHNIQUE: Contiguous axial images were obtained from the base of the skull through the vertex without intravenous contrast. COMPARISON:  MRI head 02/13/2017 FINDINGS: Brain: No evidence of large-territorial acute infarction. No parenchymal hemorrhage. No mass lesion. No extra-axial collection. No mass effect or midline shift. No hydrocephalus. Basilar cisterns are patent. Vascular: No hyperdense vessel. Skull: No acute fracture or focal lesion. Sinuses/Orbits: Complete opacification of the partially visualized left maxillary sinus. Mucosal thickening of the right maxillary sinus. Paranasal sinuses and mastoid air cells are clear. The orbits are unremarkable. Other: None. IMPRESSION: 1.  No acute intracranial abnormality. 2. Bilateral maxillary sinus disease. Electronically Signed   By: Tish Frederickson M.D.   On: 12/11/2020 19:28   CT Angio Neck W and/or Wo Contrast  Result Date: 12/12/2020 CLINICAL DATA:  Facial trauma EXAM: CT ANGIOGRAPHY HEAD AND NECK TECHNIQUE: Multidetector CT imaging of the head and neck was performed using the standard protocol during bolus administration of intravenous contrast.  Multiplanar CT image reconstructions and MIPs were obtained to evaluate the vascular anatomy. Carotid stenosis measurements (when applicable) are obtained utilizing NASCET criteria, using the distal internal carotid diameter as the denominator. CONTRAST:  35mL OMNIPAQUE IOHEXOL 350 MG/ML SOLN COMPARISON:  None. FINDINGS: CTA NECK FINDINGS SKELETON: There is no bony spinal canal stenosis. No lytic or blastic lesion. OTHER NECK: Normal pharynx, larynx and major salivary glands. No cervical lymphadenopathy. Unremarkable thyroid gland. UPPER CHEST: No pneumothorax or pleural effusion. No nodules or masses. AORTIC ARCH: There is no calcific atherosclerosis of the aortic arch. There is no  aneurysm, dissection or hemodynamically significant stenosis of the visualized portion of the aorta. Conventional 3 vessel aortic branching pattern. The visualized proximal subclavian arteries are widely patent. RIGHT CAROTID SYSTEM: Normal without aneurysm, dissection or stenosis. LEFT CAROTID SYSTEM: Normal without aneurysm, dissection or stenosis. VERTEBRAL ARTERIES: Left dominant configuration. Both origins are clearly patent. There is no dissection, occlusion or flow-limiting stenosis to the skull base (V1-V3 segments). CTA HEAD FINDINGS POSTERIOR CIRCULATION: --Vertebral arteries: Normal V4 segments. --Inferior cerebellar arteries: Normal. --Basilar artery: Normal. --Superior cerebellar arteries: Normal. --Posterior cerebral arteries (PCA): Normal. ANTERIOR CIRCULATION: --Intracranial internal carotid arteries: Normal. --Anterior cerebral arteries (ACA): Normal. Both A1 segments are present. Patent anterior communicating artery (a-comm). --Middle cerebral arteries (MCA): Normal. VENOUS SINUSES: As permitted by contrast timing, patent. ANATOMIC VARIANTS: None Review of the MIP images confirms the above findings. IMPRESSION: Normal CTA of the head and neck. Electronically Signed   By: Deatra Robinson M.D.   On: 12/12/2020 00:25     Radiology CT Angio Head W or Wo Contrast  Result Date: 12/12/2020 CLINICAL DATA:  Facial trauma EXAM: CT ANGIOGRAPHY HEAD AND NECK TECHNIQUE: Multidetector CT imaging of the head and neck was performed using the standard protocol during bolus administration of intravenous contrast. Multiplanar CT image reconstructions and MIPs were obtained to evaluate the vascular anatomy. Carotid stenosis measurements (when applicable) are obtained utilizing NASCET criteria, using the distal internal carotid diameter as the denominator. CONTRAST:  79mL OMNIPAQUE IOHEXOL 350 MG/ML SOLN COMPARISON:  None. FINDINGS: CTA NECK FINDINGS SKELETON: There is no bony spinal canal stenosis. No lytic or  blastic lesion. OTHER NECK: Normal pharynx, larynx and major salivary glands. No cervical lymphadenopathy. Unremarkable thyroid gland. UPPER CHEST: No pneumothorax or pleural effusion. No nodules or masses. AORTIC ARCH: There is no calcific atherosclerosis of the aortic arch. There is no aneurysm, dissection or hemodynamically significant stenosis of the visualized portion of the aorta. Conventional 3 vessel aortic branching pattern. The visualized proximal subclavian arteries are widely patent. RIGHT CAROTID SYSTEM: Normal without aneurysm, dissection or stenosis. LEFT CAROTID SYSTEM: Normal without aneurysm, dissection or stenosis. VERTEBRAL ARTERIES: Left dominant configuration. Both origins are clearly patent. There is no dissection, occlusion or flow-limiting stenosis to the skull base (V1-V3 segments). CTA HEAD FINDINGS POSTERIOR CIRCULATION: --Vertebral arteries: Normal V4 segments. --Inferior cerebellar arteries: Normal. --Basilar artery: Normal. --Superior cerebellar arteries: Normal. --Posterior cerebral arteries (PCA): Normal. ANTERIOR CIRCULATION: --Intracranial internal carotid arteries: Normal. --Anterior cerebral arteries (ACA): Normal. Both A1 segments are present. Patent anterior communicating artery (a-comm). --Middle cerebral arteries (MCA): Normal. VENOUS SINUSES: As permitted by contrast timing, patent. ANATOMIC VARIANTS: None Review of the MIP images confirms the above findings. IMPRESSION: Normal CTA of the head and neck. Electronically Signed   By: Deatra Robinson M.D.   On: 12/12/2020 00:25   CT HEAD WO CONTRAST ( )  Result Date: 12/11/2020 CLINICAL DATA:  Nonspecific dizziness. right arm numbness, dizziness EXAM: CT  HEAD WITHOUT CONTRAST TECHNIQUE: Contiguous axial images were obtained from the base of the skull through the vertex without intravenous contrast. COMPARISON:  MRI head 02/13/2017 FINDINGS: Brain: No evidence of large-territorial acute infarction. No parenchymal hemorrhage.  No mass lesion. No extra-axial collection. No mass effect or midline shift. No hydrocephalus. Basilar cisterns are patent. Vascular: No hyperdense vessel. Skull: No acute fracture or focal lesion. Sinuses/Orbits: Complete opacification of the partially visualized left maxillary sinus. Mucosal thickening of the right maxillary sinus. Paranasal sinuses and mastoid air cells are clear. The orbits are unremarkable. Other: None. IMPRESSION: 1.  No acute intracranial abnormality. 2. Bilateral maxillary sinus disease. Electronically Signed   By: Tish Frederickson M.D.   On: 12/11/2020 19:28   CT Angio Neck W and/or Wo Contrast  Result Date: 12/12/2020 CLINICAL DATA:  Facial trauma EXAM: CT ANGIOGRAPHY HEAD AND NECK TECHNIQUE: Multidetector CT imaging of the head and neck was performed using the standard protocol during bolus administration of intravenous contrast. Multiplanar CT image reconstructions and MIPs were obtained to evaluate the vascular anatomy. Carotid stenosis measurements (when applicable) are obtained utilizing NASCET criteria, using the distal internal carotid diameter as the denominator. CONTRAST:  80mL OMNIPAQUE IOHEXOL 350 MG/ML SOLN COMPARISON:  None. FINDINGS: CTA NECK FINDINGS SKELETON: There is no bony spinal canal stenosis. No lytic or blastic lesion. OTHER NECK: Normal pharynx, larynx and major salivary glands. No cervical lymphadenopathy. Unremarkable thyroid gland. UPPER CHEST: No pneumothorax or pleural effusion. No nodules or masses. AORTIC ARCH: There is no calcific atherosclerosis of the aortic arch. There is no aneurysm, dissection or hemodynamically significant stenosis of the visualized portion of the aorta. Conventional 3 vessel aortic branching pattern. The visualized proximal subclavian arteries are widely patent. RIGHT CAROTID SYSTEM: Normal without aneurysm, dissection or stenosis. LEFT CAROTID SYSTEM: Normal without aneurysm, dissection or stenosis. VERTEBRAL ARTERIES: Left  dominant configuration. Both origins are clearly patent. There is no dissection, occlusion or flow-limiting stenosis to the skull base (V1-V3 segments). CTA HEAD FINDINGS POSTERIOR CIRCULATION: --Vertebral arteries: Normal V4 segments. --Inferior cerebellar arteries: Normal. --Basilar artery: Normal. --Superior cerebellar arteries: Normal. --Posterior cerebral arteries (PCA): Normal. ANTERIOR CIRCULATION: --Intracranial internal carotid arteries: Normal. --Anterior cerebral arteries (ACA): Normal. Both A1 segments are present. Patent anterior communicating artery (a-comm). --Middle cerebral arteries (MCA): Normal. VENOUS SINUSES: As permitted by contrast timing, patent. ANATOMIC VARIANTS: None Review of the MIP images confirms the above findings. IMPRESSION: Normal CTA of the head and neck. Electronically Signed   By: Deatra Robinson M.D.   On: 12/12/2020 00:25    Procedures Procedures   Medications Ordered in ED Medications  acetaminophen (TYLENOL) tablet 1,000 mg (1,000 mg Oral Patient Refused/Not Given 12/12/20 0019)  iohexol (OMNIPAQUE) 350 MG/ML injection 80 mL (80 mLs Intravenous Contrast Given 12/11/20 2334)  sodium chloride 0.9 % bolus 500 mL (0 mLs Intravenous Stopped 12/12/20 0054)  ketorolac (TORADOL) 30 MG/ML injection 15 mg (15 mg Intravenous Given 12/12/20 0057)  amoxicillin-clavulanate (AUGMENTIN) 875-125 MG per tablet 1 tablet (1 tablet Oral Given 12/12/20 0057)    ED Course  I have reviewed the triage vital signs and the nursing notes.  Pertinent labs & imaging results that were available during my care of the patient were reviewed by me and considered in my medical decision making (see chart for details).    MDM Rules/Calculators/A&P                           Given the time course,  this is not an intracranial infection.  Sinus disease is not new.  I will treat it to attempt to treat the disease until patient can be seen in follow up.  I do not believe this is a cavernous sinus  thrombosis. This is chronic headache, nothing seen on CT.  Disks are sharp, intact cognition. I will refer to headache specialist.     [name] was evaluated in Emergency Department on 12/12/2020 for the symptoms described in the history of present illness. She was evaluated in the context of the global COVID-19 pandemic, which necessitated consideration that the patient might be at risk for infection with the SARS-CoV-2 virus that causes COVID-19. Institutional protocols and algorithms that pertain to the evaluation of patients at risk for COVID-19 are in a state of rapid change based on information released by regulatory bodies including the CDC and federal and state organizations. These policies and algorithms were followed during the patient's care in the ED.  Final Clinical Impression(s) / ED Diagnoses Final diagnoses:  Maxillary sinusitis, unspecified chronicity   Return for intractable cough, coughing up blood, fevers > 100.4 unrelieved by medication, shortness of breath, intractable vomiting, chest pain, shortness of breath, weakness, numbness, changes in speech, facial asymmetry, abdominal pain, passing out, Inability to tolerate liquids or food, cough, altered mental status or any concerns. No signs of systemic illness or infection. The patient is nontoxic-appearing on exam and vital signs are within normal limits. I have reviewed the triage vital signs and the nursing notes. Pertinent labs & imaging results that were available during my care of the patient were reviewed by me and considered in my medical decision making (see chart for details). After history, exam, and medical workup I feel the patient has been appropriately medically screened and is safe for discharge home. Pertinent diagnoses were discussed with the patient. Patient was given return precautions. Rx / DC Orders ED Discharge Orders          Ordered    amoxicillin-clavulanate (AUGMENTIN) 875-125 MG tablet  2 times  daily        12/12/20 0037             Teliah Buffalo, MD 12/12/20 [id number]

## 2020-12-13 NOTE — Progress Notes (Signed)
Referring:  Gillian Scarce, MD [address] [place],  [place] [address]  PCP: Gillian Scarce, MD  Neurology was asked to evaluate [name], a 45 year old female for a chief complaint of headaches. Our recommendations of care will be communicated by shared medical record.    CC:  Headaches  HPI:  Medical co-morbidities: NAFLD, chronic sinusitis  The patient presents for evaluation of headaches which have been present intermittently since her 32s. Headaches have worsened in the past 4 years. Over time they have decreased in frequency, but increased in intensity and duration. No clear trigger for worsening of headaches. Currently she has had 2-3 per year. Always occur when she is asleep and will wake her up from sleep around 3-4 AM. Duration ranges from 2-10 minutes. They are generally left sided radiating from her temple down her left neck and shoulder. No associated migraine symptoms or ipsilateral autonomic symptoms. She has not tried any abortive medications for her headaches.  She had an MRI brain in 2018 when headaches first worsened which showed a normal brain and was suggestive of sinusitis. Took trokendi around this time and eventually stopped it as headaches improved.  She was recently seen in the ED for a severe headache on 12/11/20, where [facility] showed opacification of left maxillary sinus. CTA head/neck was unremarkable.   Has done PT previously which helped with neck/shoulder tension.  Headache History: Triggers: none Most common time of day for headache to begin: 3-4 AM Onset of headache to peak (gradual vs sudden): wakes up with them Aura: no Location: right side, back of head radiating down to shoulders Quality/Description: sharp, throbbing Associated Symptoms:  Photophobia: no (lights are usually off)  Phonophobia: no  Nausea: no Vomiting: no Bilateral eye tearing, no ipsilateral runny nose, eye redness, or other autonomic symptoms Worse with activity?:  yes Duration of headaches: 2-10 minutes Migraine headache days per month: 0 Non-migraine headache days per month: 1 Non-migraine headache severity: 10/10  Headache days per month: 1 Headache free days per month: 29  Current Treatment: Abortive none  Preventative none  Prior Therapies                                 Duration of Use           Dose                          Side effect trokendi 50 mg daily - helped but stopped it because they improved   Headache Risk Factors: Headache risk factors and/or co-morbidities (+) Neck Pain (-) Back Pain (-) History of Motor Vehicle Accident (-) Sleep Disorder (-) Fibromyalgia (+) Obesity  Body mass index is 34.14 kg/m. (-) History of Traumatic Brain Injury and/or Concussion (-) History of Syncope (-) TMJ Dysfunction/Bruxism  LABS: 12/11/20: CMP, CBC wnl  IMAGING:  CTH 12/11/20: opacification of left maxillary sinus, mucosal thickening of right maxillary sinus. No acute intracranial process  CTA Head/neck 12/11/20: unremarkable  MRI Brain 02/13/2017: Inflammatory changes of paranasal sinuses, normal appearance of brain  Imaging independently reviewed on December 15, 2020   Current Outpatient Medications on File Prior to Visit  Medication Sig Dispense Refill   albuterol (VENTOLIN HFA) 108 (90 Base) MCG/ACT inhaler Inhale 2 puffs into the lungs every 6 (six) hours as needed for wheezing or shortness of breath. 1 Inhaler 2  amoxicillin-clavulanate (AUGMENTIN) 875-125 MG tablet Take 1 tablet by mouth 2 (two) times daily. One po bid x 7 days 14 tablet 0   azelastine (ASTELIN) 0.1 % nasal spray Place 2 sprays into both nostrils as needed.     cetirizine (ZYRTEC) 10 MG tablet Take 10 mg by mouth as needed.     famotidine (PEPCID) 20 MG tablet Take 1 tablet (20 mg total) by mouth 2 (two) times daily. 60 tablet 3   fluticasone (FLONASE) 50 MCG/ACT nasal spray Place 1 spray into both nostrils daily. 16 g 2   HYDROcodone-acetaminophen  (NORCO/VICODIN) 5-325 MG tablet Take 1 tablet by mouth every 6 (six) hours as needed. 10 tablet 0   hydrOXYzine (ATARAX/VISTARIL) 25 MG tablet      ibuprofen (ADVIL,MOTRIN) 200 MG tablet Take 3 tablets (600 mg total) by mouth every 6 (six) hours as needed. 120 tablet 0   montelukast (SINGULAIR) 10 MG tablet TAKE 1 TABLET BY MOUTH EVERYDAY AT BEDTIME 30 tablet 5   Vitamin D, Ergocalciferol, 50 MCG (2000 UT) CAPS Take by mouth.     No current facility-administered medications on file prior to visit.    Allergies: Allergies  Allergen Reactions   Multihance [Gadobenate] Nausea And Vomiting    MRI contrast   Other     Cat dander   Peanuts [Peanut Oil] Itching and Other (See Comments)    Runny nose, Sneezing   Pollen Extract Itching and Other (See Comments)    Runny nose, Sneezing    Family History: Migraine or other headaches in the family:  no Aneurysms in a first degree relative:  no Brain tumors in the family:  no Other neurological illness in the family:   no  Past Medical History: Past Medical History:  Diagnosis Date   Asthma    Headache(784.0)    Postpartum care following vaginal delivery (5/11) 08/10/2014   Urticaria     Past Surgical History Past Surgical History:  Procedure Laterality Date   DILATION AND CURETTAGE OF UTERUS     HERNIA REPAIR     INTRAUTERINE DEVICE (IUD) INSERTION      Social History: Social History   Tobacco Use   Smoking status: Never   Smokeless tobacco: Never  Vaping Use   Vaping Use: Never used  Substance Use Topics   Alcohol use: Yes    Comment: Occasional   Drug use: No    ROS: Negative for fevers, chills. Positive for headache, lacrimation. All other systems reviewed and negative unless states otherwise in HPI.   Physical Exam:  GENERAL EXAM/CONSTITUTIONAL: Vitals:  Vitals:   12/15/20 1052  BP: 112/76  Pulse: 100  Weight: 218 lb (98.9 kg)  Height: 5\' 7"  (1.702 m)   Body mass index is 34.14 kg/m. Wt Readings from  Last 3 Encounters:  12/15/20 218 lb (98.9 kg)  12/11/20 215 lb (97.5 kg)  06/05/20 214 lb 12.8 oz (97.4 kg)   Patient is in no distress; well developed, nourished and groomed; neck is supple  CARDIOVASCULAR: Regular rate and rhythm, no murmurs Examination of peripheral vascular system by observation and palpation is normal  EYES: Ophthalmoscopic exam of optic discs and posterior segments is normal; no papilledema or hemorrhages  MUSCULOSKELETAL: Gait, strength, tone, movements noted in Neurologic exam below  NEUROLOGIC: MENTAL STATUS:  awake, alert, oriented to person, place and time recent and remote memory intact normal attention and concentration language fluent, comprehension intact, naming intact fund of knowledge appropriate  CRANIAL NERVE:  2nd -  no papilledema on fundoscopic exam 2nd, 3rd, 4th, 6th - pupils equal and reactive to light, visual fields full to confrontation, extraocular muscles intact, no nystagmus 5th - mildly diminished sensation right V1 (90% of normal), otherwise facial sensation symmetric 7th - facial strength symmetric 8th - hearing intact 9th - palate elevates symmetrically, uvula midline 11th - shoulder shrug symmetric 12th - tongue protrusion midline  MOTOR:  normal bulk and tone, full strength in the BUE, BLE  SENSORY:  normal and symmetric to light touch all four extremities  COORDINATION:  finger-nose-finger, fine finger movements normal  REFLEXES:  deep tendon reflexes present and symmetric  GAIT/STATION:  Normal based gait   IMPRESSION: 45 year old female with a history of NAFLD, chronic sinusitis who presents for evaluation of headaches which have increased in intensity and duration over the past 4 years. They are not associated with migrainous or ipsilateral autonomic features. She does report ipsilateral neck pain and intermittent left arm numbness. Will order MRI C-spine to rule out underlying cervical stenosis. For now will  treat as cervicogenic headache/occipital neuralgia. She will try Aleve at the onset of her next headache. Discussed that it may be difficult to find an effective rescue medication given short duration of headaches. Discussed treatment options including neck PT, occipital nerve block, and daily preventive medications. She would prefer to start a preventive medication as a first step. Will start low dose amitriptyline as she reports some difficulty falling asleep.  Discussed how chronic sinusitis may contribute to headaches. Referral to ENT placed.  PLAN: -Start amitriptyline 10 mg QHS x1 week, then increase to 20 mg QHS -MRI c-spine -Referral to ENT placed for sinusitis management   Headache education was done. Discussed treatment options including preventive and acute medications, natural supplements, and infusion therapy. Discussed medication overuse headache and to limit use of acute treatments to no more than 2 days/week or 10 days/month. Discussed medication side effects, adverse reactions and drug interactions. Written educational materials and patient instructions outlining all of the above were given.  Follow-up: 3 months   Ocie Doyne, MD 12/15/2020   12:09 PM

## 2020-12-15 ENCOUNTER — Other Ambulatory Visit: Payer: Self-pay

## 2020-12-15 ENCOUNTER — Encounter: Payer: Self-pay | Admitting: Psychiatry

## 2020-12-15 ENCOUNTER — Ambulatory Visit: Payer: 59 | Admitting: Psychiatry

## 2020-12-15 VITALS — BP 112/76 | HR 100 | Ht 67.0 in | Wt 218.0 lb

## 2020-12-15 DIAGNOSIS — J321 Chronic frontal sinusitis: Secondary | ICD-10-CM

## 2020-12-15 DIAGNOSIS — G4486 Cervicogenic headache: Secondary | ICD-10-CM

## 2020-12-15 DIAGNOSIS — M5410 Radiculopathy, site unspecified: Secondary | ICD-10-CM

## 2020-12-15 MED ORDER — AMITRIPTYLINE HCL 10 MG PO TABS: 10.0000 mg | ORAL_TABLET | Freq: Every day | ORAL | 3 refills | Status: DC

## 2020-12-15 NOTE — Patient Instructions (Addendum)
Start amitriptyline 10 mg (1 pill) at bedtime for one week, then increase to 20 mg (2 pills) at bedtime Try an over the counter medication like Aleve at the onset of next headache MRI neck Referral to ENT for sinusitis Follow up 3 months

## 2020-12-27 ENCOUNTER — Telehealth: Payer: Self-pay | Admitting: Psychiatry

## 2020-12-27 NOTE — Telephone Encounter (Signed)
[employer] Berkley Harvey: [id number] (exp. 12/20/20 to 02/03/21).  I spoke with the patient due to the cost she is going to hold off right now.

## 2021-01-07 ENCOUNTER — Other Ambulatory Visit: Payer: Self-pay | Admitting: Psychiatry

## 2021-02-07 ENCOUNTER — Telehealth: Payer: Self-pay | Admitting: Psychiatry

## 2021-02-07 NOTE — Telephone Encounter (Signed)
I called Dr. Bates office the lady I spoke with stating she is the only scheduler but she did receive the patient referral and she is working on calling the patient to schedule.  

## 2021-02-27 ENCOUNTER — Telehealth: Payer: Self-pay | Admitting: Psychiatry

## 2021-02-27 NOTE — Telephone Encounter (Signed)
Appointment changed due to MD out, unable to LVM on pt's phone (vm box full), sent mychart msg informing them of new appt day/time.

## 2021-03-12 ENCOUNTER — Ambulatory Visit: Payer: 59 | Admitting: Psychiatry

## 2021-03-13 ENCOUNTER — Ambulatory Visit: Payer: 59 | Admitting: Psychiatry

## 2021-03-13 ENCOUNTER — Encounter: Payer: Self-pay | Admitting: Psychiatry

## 2021-03-13 VITALS — BP 114/74 | HR 105 | Ht 67.0 in | Wt 224.0 lb

## 2021-03-13 DIAGNOSIS — M5481 Occipital neuralgia: Secondary | ICD-10-CM | POA: Diagnosis not present

## 2021-03-13 NOTE — Progress Notes (Signed)
° °  CC:  headaches  Follow-up Visit  Last visit: 12/15/20  Brief HPI: 45 year old female with a history of NAFLD, chronic sinusitis who follows in clinic for chronic right sided headaches which have worsened over the past 4 years.   At her last visit she was started on amitriptyline for headache prevention. MRI C-spine was ordered. She was referred to ENT for management of chronic sinuitis.  Interval History: Since her last visit she has not had any major headaches. Did not notice side effects with amitriptyline but stopped having headaches so she stopped taking it after one week. Has a few tension headaches when she doesn't get enough sleep, but these are mild and not bothersome to her.  She is waiting until she meets her deductible to get MRI C-spine done  Has an appointment with ENT next week for her sinus issues.   Headache days per month: 0 Headache free days per month: 30  Current Headache Regimen: none   Prior Therapies                                  Trokendi Amitriptyline (only took for 1 week, stopped because headaches stopped)  Physical Exam:   Vital Signs: BP 114/74    Pulse (!) 105    Ht $R'5\' 7"'Fm$  (1.702 m)    Wt 224 lb (101.6 kg)    SpO2 97%    BMI 35.08 kg/m  GENERAL:  well appearing, in no acute distress, alert  SKIN:  Color, texture, turgor normal. No rashes or lesions HEAD:  Normocephalic/atraumatic. RESP: normal respiratory effort MSK:  No tenderness to palpation over neck or occiput  NEUROLOGICAL: Mental Status: Alert, oriented to person, place and time, Follows commands, and Speech fluent and appropriate. Cranial Nerves: PERRL, face symmetric, no dysarthria, hearing grossly intact Motor: moves all extremities equally Gait: normal-based.  IMPRESSION: 45 year old female who presents for follow up of headaches and left arm numbness. She has not had any headaches since her last visit and has stopped amitriptyline. She plans to do MRI C-spine when she has  met her deductible. Otherwise she will follow up as needed if headaches return.  PLAN: -Stop amitriptyline -MRI C-spine when able  Follow-up: as needed if headaches return  I spent a total of 22 minutes on the date of the service. Headache education was done.   Genia Harold, MD 03/13/21 1:45 PM

## 2021-03-13 NOTE — Patient Instructions (Signed)
Stop amitriptyline 2.   MRI C-spine when able, let me know if order expires and I can re-order it 3.   Return if headaches worsen

## 2021-05-20 IMAGING — CR LEFT KNEE - COMPLETE 4+ VIEW
4 series · 4 of 4 positions shown · non-contrast
Comparison: None.

CLINICAL DATA: Injury.  Left knee pain.

EXAM:
LEFT KNEE - COMPLETE 4+ VIEW

[t knee ap left]
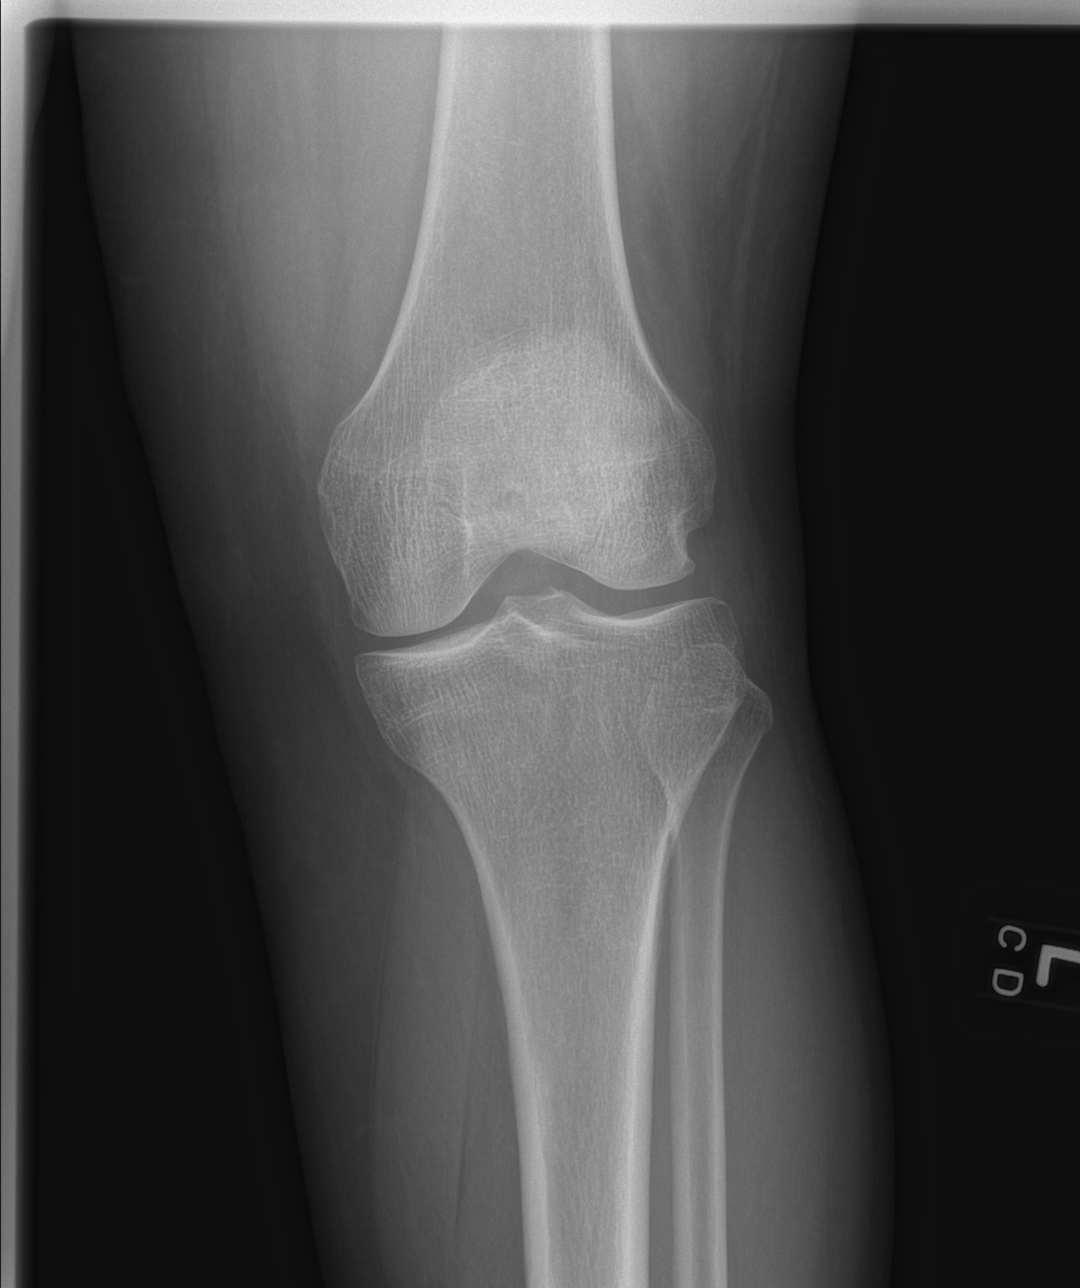

[t knee obl left (1 of 2)]
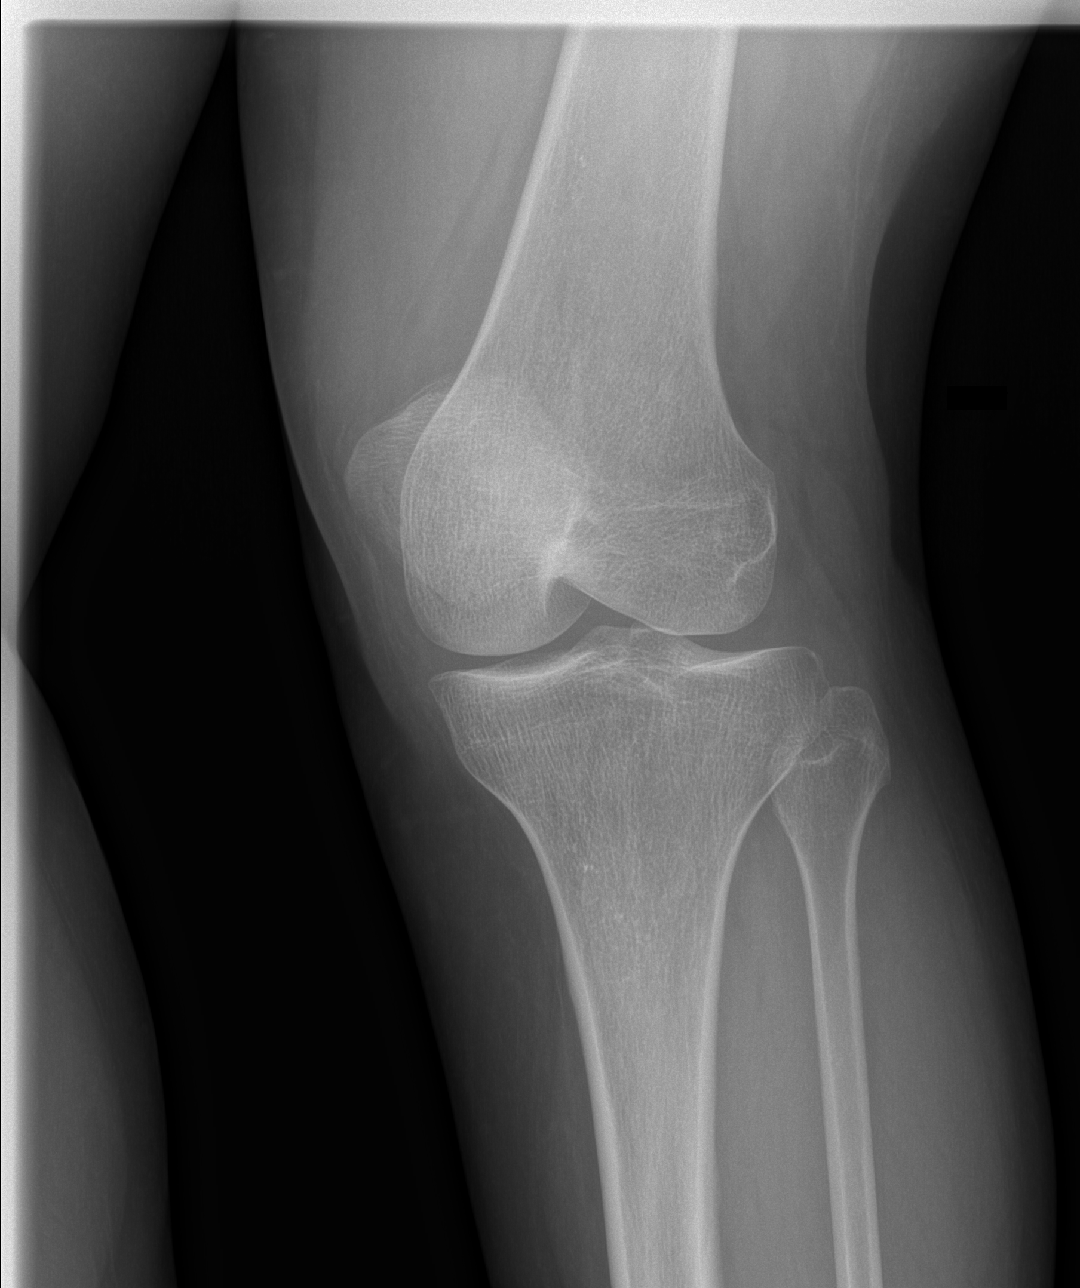

[t knee obl left (2 of 2)]
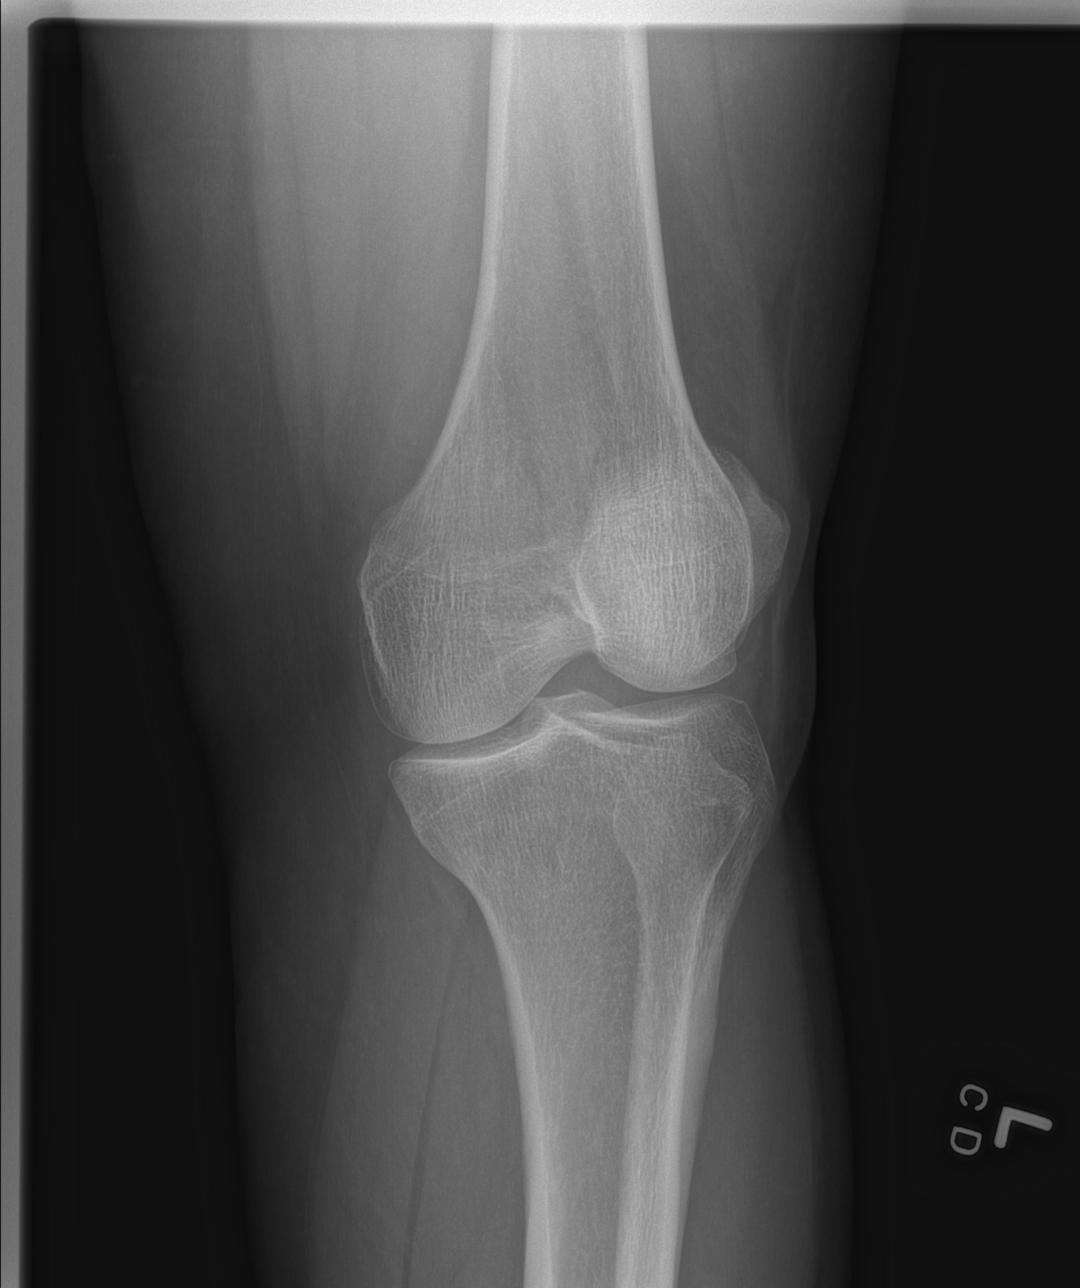

[t knee lat left]
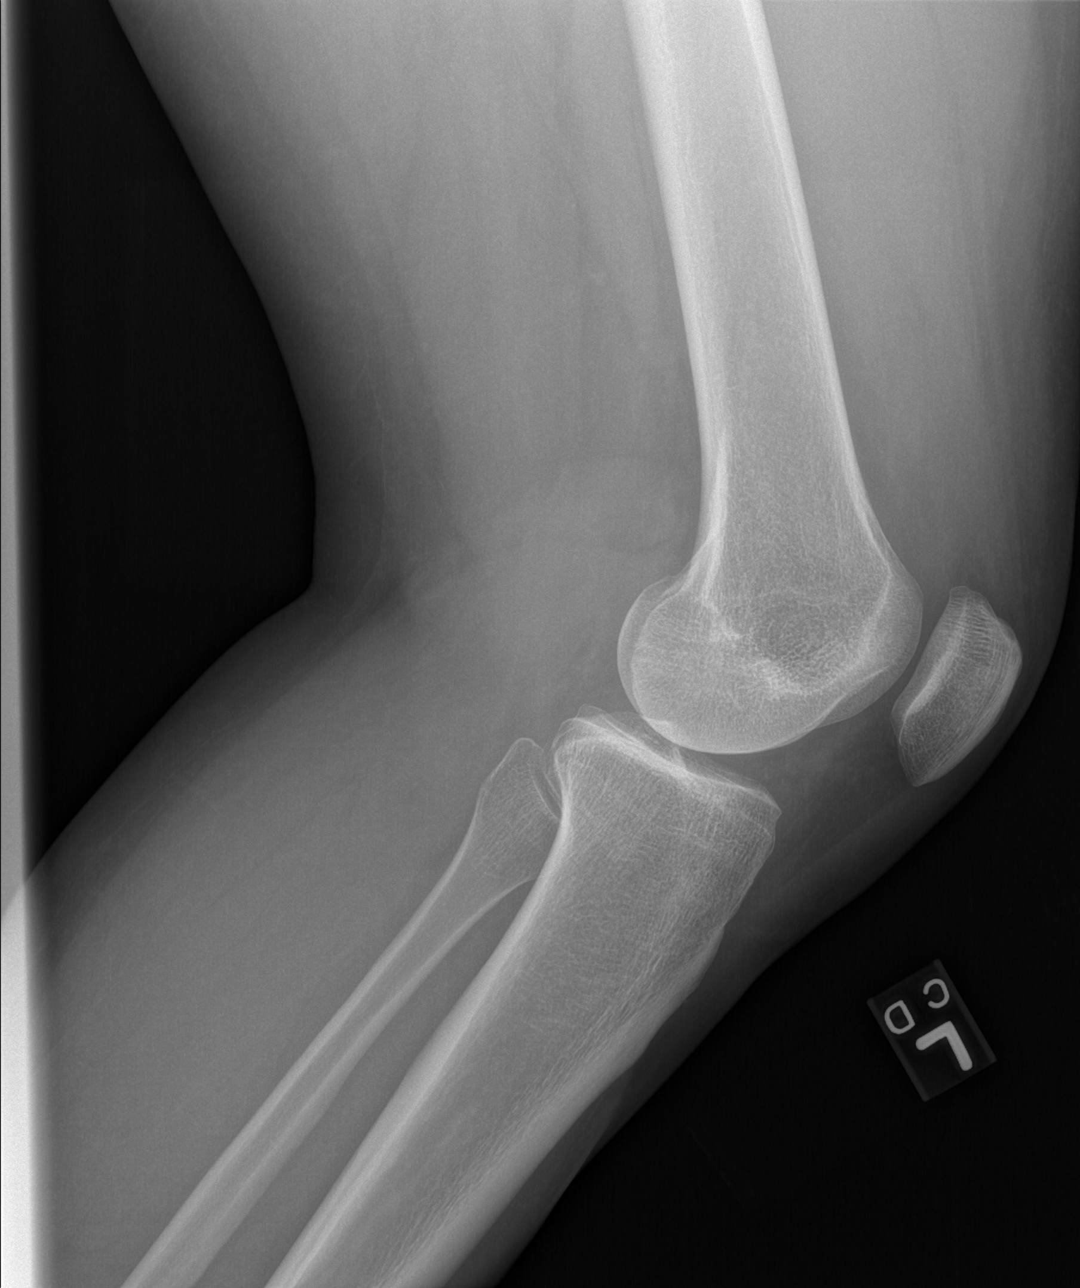

[4 of 4 positions shown; findings below may reference images not displayed]

FINDINGS: No evidence of fracture, dislocation, or joint effusion. No evidence
of arthropathy or other focal bone abnormality. Soft tissues are
unremarkable.
IMPRESSION: Negative.

## 2021-07-31 ENCOUNTER — Other Ambulatory Visit: Payer: Self-pay | Admitting: Allergy & Immunology

## 2021-08-13 ENCOUNTER — Other Ambulatory Visit: Payer: Self-pay | Admitting: Allergy & Immunology

## 2022-01-21 ENCOUNTER — Encounter: Payer: Self-pay | Admitting: *Deleted

## 2022-02-12 NOTE — Progress Notes (Signed)
CARDIOLOGY CONSULT NOTE       Patient ID: [name] MRN: [id number] DOB/AGE: 06/03/1975 46 y.o.  Admit date: (Not on file) Referring Physician: Angelica Pou NP Primary Physician: Gillian Scarce, MD (Inactive) Primary Cardiologist: New Reason for Consultation: Palpitations   HPI:  46 y.o. referred by Samuel Bouche NP for palpitations History of Asthma, headaches and seasonal allergies She has had some dizziness associated with her headaches and right arm numbness CT head negative September 2022 Carotids/Vertebrals normal on CTA 12/12/20 She does have recurrent maxillary sinusitis   She is taking phentermine for weight loss was on it in past and restarted a month ago She denies palpitations to me More worried about family history  Stress from work She has degree in adult education from [employer] a staff for social services And family medicare/medicaid which has just been expanded Has two older children one in [place] married with one grand son and one at [employer] Has a 6/48 yo at home   She has been getting up in am and working out lately and feels better. No chest pain dyspnea or syncope   ROS All other systems reviewed and negative except as noted above  Past Medical History:  Diagnosis Date   Asthma    Headache(784.0)    Postpartum care following vaginal delivery (5/11) 08/10/2014   Urticaria     Family History  Problem Relation Age of Onset   Diabetes Mother    Hypertension Mother    Rheum arthritis Mother    Cancer Father    Cancer Paternal Grandmother    Allergic rhinitis Neg Hx    Asthma Neg Hx    Eczema Neg Hx    Urticaria Neg Hx     Social History   Socioeconomic History   Marital status: Married    Spouse name: Doctor, general practice   Number of children: 3   Years of education: 18   Highest education level: Not on file  Occupational History   Occupation: CASE WORKER    Employer: [employer]  Tobacco Use   Smoking status: Never   Smokeless  tobacco: Never  Vaping Use   Vaping Use: Never used  Substance and Sexual Activity   Alcohol use: Yes    Comment: Occasional   Drug use: No   Sexual activity: Yes    Partners: Male    Birth control/protection: None  Other Topics Concern   Not on file  Social History Narrative   Marital Status: Married Tax inspector)   Children: Sons (3)    Pets: None   Living Situation: Lives with husband and children   Occupation: Case Worker ([employer] [place])    Education: Master's Degree    Tobacco Use/Exposure:  None    Alcohol Use:  Occasional   Drug Use:  None   Diet:  Regular   Exercise:  Limited    Hobbies: Shopping/Reading         Social Determinants of Corporate investment banker Strain: Not on file  Food Insecurity: Not on file  Transportation Needs: Not on file  Physical Activity: Not on file  Stress: Not on file  Social Connections: Not on file  Intimate Partner Violence: Not on file    Past Surgical History:  Procedure Laterality Date   DILATION AND CURETTAGE OF UTERUS     HERNIA REPAIR     INTRAUTERINE DEVICE (IUD) INSERTION        Current Outpatient Medications:    azelastine (ASTELIN) 0.1 %  nasal spray, Place 2 sprays into both nostrils as needed., Disp: , Rfl:    cetirizine (ZYRTEC) 10 MG tablet, Take 10 mg by mouth as needed., Disp: , Rfl:    clonazePAM (KLONOPIN) 0.5 MG tablet, Take 0.5 mg by mouth 3 (three) times daily as needed., Disp: , Rfl:    fluticasone (FLONASE) 50 MCG/ACT nasal spray, Place 1 spray into both nostrils daily., Disp: 16 g, Rfl: 2   HYDROcodone-acetaminophen (NORCO/VICODIN) 5-325 MG tablet, Take 1 tablet by mouth every 6 (six) hours as needed., Disp: 10 tablet, Rfl: 0   montelukast (SINGULAIR) 10 MG tablet, TAKE 1 TABLET BY MOUTH EVERYDAY AT BEDTIME, Disp: 30 tablet, Rfl: 0   phentermine (ADIPEX-P) 37.5 MG tablet, Take 37.5 mg by mouth daily before breakfast., Disp: , Rfl:    Vitamin D, Ergocalciferol, 50 MCG (2000 UT) CAPS, Take by mouth.,  Disp: , Rfl:    albuterol (VENTOLIN HFA) 108 (90 Base) MCG/ACT inhaler, Inhale 2 puffs into the lungs every 6 (six) hours as needed for wheezing or shortness of breath. (Patient not taking: Reported on 02/25/2022), Disp: 1 Inhaler, Rfl: 2   amitriptyline (ELAVIL) 10 MG tablet, TAKE 1 TABLET (10 MG TOTAL) BY MOUTH AT BEDTIME. FOR ONE WEEK, THEN INCREASE TO 2 TABLETS AT BEDTIME (Patient not taking: Reported on 02/25/2022), Disp: 180 tablet, Rfl: 2   famotidine (PEPCID) 20 MG tablet, Take 1 tablet (20 mg total) by mouth 2 (two) times daily. (Patient not taking: Reported on 02/25/2022), Disp: 60 tablet, Rfl: 3   hydrOXYzine (ATARAX/VISTARIL) 25 MG tablet, , Disp: , Rfl:    ibuprofen (ADVIL,MOTRIN) 200 MG tablet, Take 3 tablets (600 mg total) by mouth every 6 (six) hours as needed. (Patient not taking: Reported on 02/25/2022), Disp: 120 tablet, Rfl: 0    Physical Exam: Blood pressure 118/76, pulse 99, height 5\' 7"  (1.702 m), weight 216 lb 12.8 oz (98.3 kg), SpO2 95 %, unknown if currently breastfeeding.    Affect appropriate Healthy:  appears stated age HEENT: normal Neck supple with no adenopathy JVP normal no bruits no thyromegaly Lungs clear with no wheezing and good diaphragmatic motion Heart:  S1/S2 no murmur, no rub, gallop or click PMI normal Abdomen: benighn, BS positve, no tenderness, no AAA no bruit.  No HSM or HJR Distal pulses intact with no bruits No edema Neuro non-focal Skin warm and dry No muscular weakness   Labs:   Lab Results  Component Value Date   WBC 7.5 12/11/2020   HGB 13.4 12/11/2020   HCT 38.3 12/11/2020   MCV 88.9 12/11/2020   PLT 213 12/11/2020      Radiology: No results found.  EKG: SR rate 99 insignificant Q waves in 2,3,F no old tracing    ASSESSMENT AND PLAN:   Palpitations: benign sounding normal cardiac exam no high risk features on ECG see below TTE to r/o structural heart dx Headaches:  previously on amitriptyline CT/MRI head ok History  of maxillary sinus infections F/U ENT Has been seen by neurology in past    Allergies:  continue Zyrtec and Flonase as needed  CAD :; risk discussed utility of calcium score Abnormal ECG:  Q waves in 2,3,F TTE see above r/o HOCM   TTE Calcium score    F/U PRN pending study results   Signed: 02/10/2021 02/25/2022, 3:11 PM

## 2022-02-25 ENCOUNTER — Ambulatory Visit: Payer: 59 | Attending: Cardiovascular Disease | Admitting: Cardiovascular Disease

## 2022-02-25 ENCOUNTER — Encounter: Payer: Self-pay | Admitting: Cardiovascular Disease

## 2022-02-25 VITALS — BP 118/76 | HR 99 | Ht 67.0 in | Wt 216.8 lb

## 2022-02-25 DIAGNOSIS — R002 Palpitations: Secondary | ICD-10-CM

## 2022-02-25 DIAGNOSIS — R9431 Abnormal electrocardiogram [ECG] [EKG]: Secondary | ICD-10-CM | POA: Diagnosis not present

## 2022-02-25 NOTE — Patient Instructions (Signed)
Medication Instructions:  NO CHANGES *If you need a refill on your cardiac medications before your next appointment, please call your pharmacy*   Lab Work: NONE If you have labs (blood work) drawn today and your tests are completely normal, you will receive your results only by: MyChart Message (if you have MyChart) OR A paper copy in the mail If you have any lab test that is abnormal or we need to change your treatment, we will call you to review the results.   Testing/Procedures: CALCIUM SCORE OUT OF POCKET $99.00  Your physician has requested that you have an echocardiogram. Echocardiography is a painless test that uses sound waves to create images of your heart. It provides your doctor with information about the size and shape of your heart and how well your heart's chambers and valves are working. This procedure takes approximately one hour. There are no restrictions for this procedure. Please do NOT wear cologne, perfume, aftershave, or lotions (deodorant is allowed). Please arrive 15 minutes prior to your appointment time.    Follow-Up: At [facility], you and your health needs are our priority.  As part of our continuing mission to provide you with exceptional heart care, we have created designated Provider Care Teams.  These Care Teams include your primary Cardiologist (physician) and Advanced Practice Providers (APPs -  Physician Assistants and Nurse Practitioners) who all work together to provide you with the care you need, when you need it.  We recommend signing up for the patient portal called "MyChart".  Sign up information is provided on this After Visit Summary.  MyChart is used to connect with patients for Virtual Visits (Telemedicine).  Patients are able to view lab/test results, encounter notes, upcoming appointments, etc.  Non-urgent messages can be sent to your provider as well.   To learn more about what you can do with MyChart, go to [email or web address].     Your next appointment:   AS NEEDED  The format for your next appointment:     Provider:       Other Instructions NONE  Important Information About Sugar

## 2022-03-11 ENCOUNTER — Ambulatory Visit (HOSPITAL_COMMUNITY)
Admission: RE | Admit: 2022-03-11 | Discharge: 2022-03-11 | Disposition: A | Discharge status: Home / Self Care | Payer: 59 | Source: Ambulatory Visit | Attending: Cardiovascular Disease | Admitting: Cardiovascular Disease

## 2022-03-11 DIAGNOSIS — R9431 Abnormal electrocardiogram [ECG] [EKG]: Secondary | ICD-10-CM | POA: Insufficient documentation

## 2022-03-15 ENCOUNTER — Ambulatory Visit (HOSPITAL_COMMUNITY): Payer: 59 | Attending: Cardiology

## 2022-03-15 DIAGNOSIS — R9431 Abnormal electrocardiogram [ECG] [EKG]: Secondary | ICD-10-CM | POA: Insufficient documentation

## 2022-03-15 LAB — ECHOCARDIOGRAM COMPLETE
Area-P 1/2: 4.15 cm2
S' Lateral: 3.4 cm

## 2022-06-18 ENCOUNTER — Other Ambulatory Visit: Payer: Self-pay | Admitting: Gastroenterology

## 2022-06-18 DIAGNOSIS — R1310 Dysphagia, unspecified: Secondary | ICD-10-CM

## 2022-06-28 ENCOUNTER — Other Ambulatory Visit: Payer: Self-pay

## 2022-07-05 ENCOUNTER — Ambulatory Visit
Admission: RE | Admit: 2022-07-05 | Discharge: 2022-07-05 | Disposition: A | Discharge status: Home / Self Care | Payer: 59 | Source: Ambulatory Visit | Attending: Gastroenterology | Admitting: Gastroenterology

## 2022-07-05 DIAGNOSIS — R1310 Dysphagia, unspecified: Secondary | ICD-10-CM

## 2022-12-05 ENCOUNTER — Telehealth: Payer: Self-pay | Admitting: Psychiatry

## 2022-12-05 NOTE — Telephone Encounter (Signed)
The NAFAD (non alcoholic fatty acid disease) came from referral notes for this pt back in 2022.

## 2022-12-05 NOTE — Telephone Encounter (Signed)
Pt called wanting to speak to the MD to ask why it was put in her chart that she has NAFLD when the pt has never been informed that she had that. Pt would like to know where did this information come from. Please advise.

## 2022-12-09 NOTE — Telephone Encounter (Signed)
Noted from referrals that pt was an ED referral.

## 2022-12-16 NOTE — Telephone Encounter (Signed)
Spoke with pt and let her know per Dr. Delena Bali, that we didn't put that she has NAFAD and that she was a referral and that Dr. Delena Bali said that it was probably in her referral. Pt states that her referral came from the hospital. Pt verbalized understanding.

## 2024-05-06 ENCOUNTER — Institutional Professional Consult (permissible substitution) (INDEPENDENT_AMBULATORY_CARE_PROVIDER_SITE_OTHER): Payer: Self-pay | Admitting: Otolaryngology

## 2024-06-04 ENCOUNTER — Institutional Professional Consult (permissible substitution) (INDEPENDENT_AMBULATORY_CARE_PROVIDER_SITE_OTHER): Admitting: Otolaryngology
